# Patient Record
Sex: Male | Born: 1945 | Race: White | Hispanic: No | Marital: Married | Smoking: Former smoker
Health system: Southern US, Community
[De-identification: ages and names within clinical notes are randomized; demographics above are authoritative.]

## PROBLEM LIST (undated history)

## (undated) DIAGNOSIS — H269 Unspecified cataract: Secondary | ICD-10-CM

## (undated) DIAGNOSIS — J302 Other seasonal allergic rhinitis: Secondary | ICD-10-CM

## (undated) DIAGNOSIS — I499 Cardiac arrhythmia, unspecified: Secondary | ICD-10-CM

## (undated) DIAGNOSIS — E7800 Pure hypercholesterolemia, unspecified: Secondary | ICD-10-CM

## (undated) DIAGNOSIS — Z973 Presence of spectacles and contact lenses: Secondary | ICD-10-CM

## (undated) DIAGNOSIS — I10 Essential (primary) hypertension: Secondary | ICD-10-CM

## (undated) DIAGNOSIS — M1990 Unspecified osteoarthritis, unspecified site: Secondary | ICD-10-CM

## (undated) DIAGNOSIS — I509 Heart failure, unspecified: Secondary | ICD-10-CM

## (undated) DIAGNOSIS — E119 Type 2 diabetes mellitus without complications: Secondary | ICD-10-CM

## (undated) DIAGNOSIS — G4730 Sleep apnea, unspecified: Secondary | ICD-10-CM

## (undated) DIAGNOSIS — R011 Cardiac murmur, unspecified: Secondary | ICD-10-CM

## (undated) DIAGNOSIS — K219 Gastro-esophageal reflux disease without esophagitis: Secondary | ICD-10-CM

## (undated) HISTORY — PX: BACK SURGERY: SHX140

## (undated) HISTORY — PX: HERNIA REPAIR: SHX51

## (undated) HISTORY — PX: INGUINAL HERNIA REPAIR: SHX194

## (undated) HISTORY — PX: COLONOSCOPY: SHX174

---

## 1982-09-26 HISTORY — PX: BACK SURGERY: SHX140

## 2011-09-27 HISTORY — PX: COLONOSCOPY: SHX174

## 2014-07-28 ENCOUNTER — Other Ambulatory Visit: Payer: Self-pay | Admitting: Otolaryngology

## 2014-08-05 ENCOUNTER — Encounter (HOSPITAL_COMMUNITY): Payer: Self-pay

## 2014-08-05 ENCOUNTER — Encounter (HOSPITAL_COMMUNITY)
Admission: RE | Admit: 2014-08-05 | Discharge: 2014-08-05 | Disposition: A | Discharge status: Home / Self Care | Payer: Medicare Other | Source: Ambulatory Visit | Attending: Otolaryngology | Admitting: Otolaryngology

## 2014-08-05 DIAGNOSIS — E119 Type 2 diabetes mellitus without complications: Secondary | ICD-10-CM | POA: Diagnosis not present

## 2014-08-05 DIAGNOSIS — E780 Pure hypercholesterolemia: Secondary | ICD-10-CM | POA: Diagnosis not present

## 2014-08-05 DIAGNOSIS — I10 Essential (primary) hypertension: Secondary | ICD-10-CM | POA: Diagnosis not present

## 2014-08-05 DIAGNOSIS — K219 Gastro-esophageal reflux disease without esophagitis: Secondary | ICD-10-CM | POA: Diagnosis not present

## 2014-08-05 DIAGNOSIS — I509 Heart failure, unspecified: Secondary | ICD-10-CM | POA: Diagnosis not present

## 2014-08-05 DIAGNOSIS — Z79899 Other long term (current) drug therapy: Secondary | ICD-10-CM | POA: Diagnosis not present

## 2014-08-05 DIAGNOSIS — H269 Unspecified cataract: Secondary | ICD-10-CM | POA: Diagnosis not present

## 2014-08-05 DIAGNOSIS — D110 Benign neoplasm of parotid gland: Secondary | ICD-10-CM | POA: Diagnosis not present

## 2014-08-05 DIAGNOSIS — G4730 Sleep apnea, unspecified: Secondary | ICD-10-CM | POA: Diagnosis not present

## 2014-08-05 DIAGNOSIS — Z720 Tobacco use: Secondary | ICD-10-CM | POA: Diagnosis not present

## 2014-08-05 HISTORY — DX: Pure hypercholesterolemia, unspecified: E78.00

## 2014-08-05 HISTORY — DX: Heart failure, unspecified: I50.9

## 2014-08-05 HISTORY — DX: Essential (primary) hypertension: I10

## 2014-08-05 HISTORY — DX: Sleep apnea, unspecified: G47.30

## 2014-08-05 HISTORY — DX: Cardiac murmur, unspecified: R01.1

## 2014-08-05 HISTORY — DX: Gastro-esophageal reflux disease without esophagitis: K21.9

## 2014-08-05 HISTORY — DX: Other seasonal allergic rhinitis: J30.2

## 2014-08-05 HISTORY — DX: Presence of spectacles and contact lenses: Z97.3

## 2014-08-05 HISTORY — DX: Type 2 diabetes mellitus without complications: E11.9

## 2014-08-05 HISTORY — DX: Unspecified cataract: H26.9

## 2014-08-05 LAB — BASIC METABOLIC PANEL
ANION GAP: 15 (ref 5–15)
BUN: 15 mg/dL (ref 6–23)
CHLORIDE: 102 meq/L (ref 96–112)
CO2: 24 meq/L (ref 19–32)
Calcium: 9.7 mg/dL (ref 8.4–10.5)
Creatinine, Ser: 1.04 mg/dL (ref 0.50–1.35)
GFR calc Af Amer: 84 mL/min — ABNORMAL LOW (ref >90)
GFR calc non Af Amer: 73 mL/min — ABNORMAL LOW (ref >90)
Glucose, Bld: 92 mg/dL (ref 70–99)
POTASSIUM: 4.2 meq/L (ref 3.7–5.3)
Sodium: 141 mEq/L (ref 137–147)

## 2014-08-05 LAB — CBC
HCT: 38.3 % — ABNORMAL LOW (ref 39.0–52.0)
HEMOGLOBIN: 12.7 g/dL — AB (ref 13.0–17.0)
MCH: 30.2 pg (ref 26.0–34.0)
MCHC: 33.2 g/dL (ref 30.0–36.0)
MCV: 91.2 fL (ref 78.0–100.0)
Platelets: 185 10*3/uL (ref 150–400)
RBC: 4.2 MIL/uL — AB (ref 4.22–5.81)
RDW: 15.1 % (ref 11.5–15.5)
WBC: 8.5 10*3/uL (ref 4.0–10.5)

## 2014-08-05 NOTE — Pre-Procedure Instructions (Signed)
James Paul  08/05/2014   Your procedure is scheduled on: Friday, August 08, 2014  Report to [facility] Admitting at 6:45 AM.  Call this number if you have problems the morning of surgery: [phone]   Remember: DO NOT [place]   Do not eat food or drink liquids after midnight Thursday, August 07, 2014   Take these medicines the morning of surgery with A SIP OF WATER: amLODipine (NORVASC), atenolol (TENORMIN)   Stop taking Aspirin, vitamins, and herbal medications. Do not take any NSAIDs ie: Ibuprofen, Advil, Naproxen or any medication containing Aspirin.   Do not wear jewelry, make-up or nail polish.  Do not wear lotions, powders, or perfumes. You may not  wear deodorant.  Do not shave 48 hours prior to surgery. Men may shave face and neck.  Do not bring valuables to the hospital.  [facility] is not responsible for any belongings or valuables.               Contacts, dentures or bridgework may not be worn into surgery.  Leave suitcase in the car. After surgery it may be brought to your room.  For patients admitted to the hospital, discharge time is determined by your treatment team.               Patients discharged the day of surgery will not be allowed to drive home.  Name and phone number of your driver:   Special Instructions:  Special Instructions:Special Instructions: [facility] - Preparing for Surgery  Before surgery, you can play an important role.  Because skin is not sterile, your skin needs to be as free of germs as possible.  You can reduce the number of germs on you skin by washing with CHG (chlorahexidine gluconate) soap before surgery.  CHG is an antiseptic cleaner which kills germs and bonds with the skin to continue killing germs even after washing.  Please DO NOT use if you have an allergy to CHG or antibacterial soaps.  If your skin becomes reddened/irritated stop using the CHG and inform your  nurse when you arrive at Short Stay.  Do not shave (including legs and underarms) for at least 48 hours prior to the first CHG shower.  You may shave your face.  Please follow these instructions carefully:   1.  Shower with CHG Soap the night before surgery and the morning of Surgery.  2.  If you choose to wash your hair, wash your hair first as usual with your normal shampoo.  3.  After you shampoo, rinse your hair and body thoroughly to remove the Shampoo.  4.  Use CHG as you would any other liquid soap.  You can apply chg directly  to the skin and wash gently with scrungie or a clean washcloth.  5.  Apply the CHG Soap to your body ONLY FROM THE NECK DOWN.  Do not use on open wounds or open sores.  Avoid contact with your eyes, ears, mouth and genitals (private parts).  Wash genitals (private parts) with your normal soap.  6.  Wash thoroughly, paying special attention to the area where your surgery will be performed.  7.  Thoroughly rinse your body with warm water from the neck down.  8.  DO NOT shower/wash with your normal soap after using and rinsing off the CHG Soap.  9.  Pat yourself dry with a clean towel.  10.  Wear clean pajamas.            11.  Place clean sheets on your bed the night of your first shower and do not sleep with pets.  Day of Surgery  Do not apply any lotions/deodorants the morning of surgery.  Please wear clean clothes to the hospital/surgery center.   Please read over the following fact sheets that you were given: Pain Booklet, Coughing and Deep Breathing and Surgical Site Infection Prevention

## 2014-08-06 NOTE — Progress Notes (Signed)
Voice message left with Glenda in Dr Janace Hoard office to inform him of need to sign orders in EPIC.

## 2014-08-07 NOTE — Progress Notes (Signed)
refaxed request to [facility] med. Center for cardiac studies.

## 2014-08-07 NOTE — Progress Notes (Signed)
Faxed request made yesterday to [facility]. Center-phone call made to health information records- produces no answer today.

## 2014-08-08 ENCOUNTER — Ambulatory Visit (HOSPITAL_COMMUNITY): Payer: Medicare Other | Admitting: Certified Registered"

## 2014-08-08 ENCOUNTER — Encounter (HOSPITAL_COMMUNITY)
Admission: RE | Disposition: A | Discharge status: Home / Self Care | Payer: Self-pay | Source: Ambulatory Visit | Attending: Otolaryngology

## 2014-08-08 ENCOUNTER — Observation Stay (HOSPITAL_COMMUNITY)
Admission: RE | Admit: 2014-08-08 | Discharge: 2014-08-09 | Disposition: A | Discharge status: Home / Self Care | Payer: Medicare Other | Source: Ambulatory Visit | Attending: Otolaryngology | Admitting: Otolaryngology

## 2014-08-08 ENCOUNTER — Encounter (HOSPITAL_COMMUNITY): Payer: Self-pay | Admitting: *Deleted

## 2014-08-08 DIAGNOSIS — H269 Unspecified cataract: Secondary | ICD-10-CM | POA: Insufficient documentation

## 2014-08-08 DIAGNOSIS — I509 Heart failure, unspecified: Secondary | ICD-10-CM | POA: Insufficient documentation

## 2014-08-08 DIAGNOSIS — K118 Other diseases of salivary glands: Secondary | ICD-10-CM | POA: Diagnosis present

## 2014-08-08 DIAGNOSIS — D110 Benign neoplasm of parotid gland: Principal | ICD-10-CM | POA: Insufficient documentation

## 2014-08-08 DIAGNOSIS — K219 Gastro-esophageal reflux disease without esophagitis: Secondary | ICD-10-CM | POA: Insufficient documentation

## 2014-08-08 DIAGNOSIS — G4730 Sleep apnea, unspecified: Secondary | ICD-10-CM | POA: Diagnosis not present

## 2014-08-08 DIAGNOSIS — I10 Essential (primary) hypertension: Secondary | ICD-10-CM | POA: Diagnosis not present

## 2014-08-08 DIAGNOSIS — Z720 Tobacco use: Secondary | ICD-10-CM | POA: Insufficient documentation

## 2014-08-08 DIAGNOSIS — E780 Pure hypercholesterolemia: Secondary | ICD-10-CM | POA: Insufficient documentation

## 2014-08-08 DIAGNOSIS — E119 Type 2 diabetes mellitus without complications: Secondary | ICD-10-CM | POA: Insufficient documentation

## 2014-08-08 DIAGNOSIS — Z79899 Other long term (current) drug therapy: Secondary | ICD-10-CM | POA: Insufficient documentation

## 2014-08-08 HISTORY — PX: PAROTIDECTOMY: SHX2163

## 2014-08-08 HISTORY — DX: Other diseases of salivary glands: K11.8

## 2014-08-08 LAB — CBC
HCT: 33.4 % — ABNORMAL LOW (ref 39.0–52.0)
HEMOGLOBIN: 10.9 g/dL — AB (ref 13.0–17.0)
MCH: 30.5 pg (ref 26.0–34.0)
MCHC: 32.6 g/dL (ref 30.0–36.0)
MCV: 93.6 fL (ref 78.0–100.0)
Platelets: 148 10*3/uL — ABNORMAL LOW (ref 150–400)
RBC: 3.57 MIL/uL — ABNORMAL LOW (ref 4.22–5.81)
RDW: 15.1 % (ref 11.5–15.5)
WBC: 7.2 10*3/uL (ref 4.0–10.5)

## 2014-08-08 LAB — CREATININE, SERUM
CREATININE: 0.9 mg/dL (ref 0.50–1.35)
GFR calc Af Amer: >90 mL/min (ref >90)
GFR calc non Af Amer: 87 mL/min — ABNORMAL LOW (ref >90)

## 2014-08-08 LAB — GLUCOSE, CAPILLARY
Glucose-Capillary: 125 mg/dL — ABNORMAL HIGH (ref 70–99)
Glucose-Capillary: 121 mg/dL — ABNORMAL HIGH (ref 70–99)

## 2014-08-08 SURGERY — EXCISION, PAROTID GLAND
Anesthesia: General | Site: Neck | Laterality: Left

## 2014-08-08 MED ORDER — ONDANSETRON HCL 4 MG/2ML IJ SOLN
INTRAMUSCULAR | Status: DC | PRN
  Administered 2014-08-08: 4 mg via INTRAVENOUS

## 2014-08-08 MED ORDER — FENTANYL CITRATE 0.05 MG/ML IJ SOLN
INTRAMUSCULAR | Status: DC | PRN
  Administered 2014-08-08 (×4): 50 ug via INTRAVENOUS
  Administered 2014-08-08: 100 ug via INTRAVENOUS
  Administered 2014-08-08 (×2): 50 ug via INTRAVENOUS

## 2014-08-08 MED ORDER — LIDOCAINE-EPINEPHRINE 1 %-1:100000 IJ SOLN
INTRAMUSCULAR | Status: AC
  Filled 2014-08-08: qty 1

## 2014-08-08 MED ORDER — FENTANYL CITRATE 0.05 MG/ML IJ SOLN
INTRAMUSCULAR | Status: AC
  Filled 2014-08-08: qty 5

## 2014-08-08 MED ORDER — GLYCOPYRROLATE 0.2 MG/ML IJ SOLN
INTRAMUSCULAR | Status: DC | PRN
  Administered 2014-08-08: 0.2 mg via INTRAVENOUS

## 2014-08-08 MED ORDER — PHENYLEPHRINE HCL 10 MG/ML IJ SOLN
INTRAMUSCULAR | Status: DC | PRN
  Administered 2014-08-08: 80 ug via INTRAVENOUS
  Administered 2014-08-08: 160 ug via INTRAVENOUS
  Administered 2014-08-08: 80 ug via INTRAVENOUS

## 2014-08-08 MED ORDER — PROPOFOL 10 MG/ML IV BOLUS
INTRAVENOUS | Status: DC | PRN
  Administered 2014-08-08: 30 mg via INTRAVENOUS
  Administered 2014-08-08: 60 mg via INTRAVENOUS
  Administered 2014-08-08 (×3): 30 mg via INTRAVENOUS
  Administered 2014-08-08: 40 mg via INTRAVENOUS
  Administered 2014-08-08: 30 mg via INTRAVENOUS
  Administered 2014-08-08: 160 mg via INTRAVENOUS

## 2014-08-08 MED ORDER — HYDROMORPHONE HCL 1 MG/ML IJ SOLN: 0.5000 mg | INTRAMUSCULAR | Status: DC | PRN

## 2014-08-08 MED ORDER — PROPOFOL 10 MG/ML IV BOLUS
INTRAVENOUS | Status: AC
  Filled 2014-08-08: qty 20

## 2014-08-08 MED ORDER — BACITRACIN ZINC 500 UNIT/GM EX OINT
TOPICAL_OINTMENT | CUTANEOUS | Status: AC
  Filled 2014-08-08: qty 15

## 2014-08-08 MED ORDER — EPHEDRINE SULFATE 50 MG/ML IJ SOLN
INTRAMUSCULAR | Status: DC | PRN
  Administered 2014-08-08: 15 mg via INTRAVENOUS
  Administered 2014-08-08 (×2): 10 mg via INTRAVENOUS

## 2014-08-08 MED ORDER — ONDANSETRON HCL 4 MG/2ML IJ SOLN
INTRAMUSCULAR | Status: AC
  Filled 2014-08-08: qty 2

## 2014-08-08 MED ORDER — ONDANSETRON HCL 4 MG/2ML IJ SOLN: 4.0000 mg | Freq: Four times a day (QID) | INTRAMUSCULAR | Status: DC | PRN

## 2014-08-08 MED ORDER — PHENYLEPHRINE HCL 10 MG/ML IJ SOLN
10.0000 mg | INTRAVENOUS | Status: DC | PRN
  Administered 2014-08-08: 20 ug/min via INTRAVENOUS

## 2014-08-08 MED ORDER — ARTIFICIAL TEARS OP OINT
TOPICAL_OINTMENT | OPHTHALMIC | Status: DC | PRN
  Administered 2014-08-08: 1 via OPHTHALMIC

## 2014-08-08 MED ORDER — LIDOCAINE HCL (CARDIAC) 20 MG/ML IV SOLN
INTRAVENOUS | Status: AC
  Filled 2014-08-08: qty 5

## 2014-08-08 MED ORDER — HYDROCODONE-ACETAMINOPHEN 5-325 MG PO TABS
1.0000 | ORAL_TABLET | ORAL | Status: DC | PRN
  Administered 2014-08-08 – 2014-08-09 (×2): 2 via ORAL
  Filled 2014-08-08 (×2): qty 2

## 2014-08-08 MED ORDER — MIDAZOLAM HCL 2 MG/2ML IJ SOLN
INTRAMUSCULAR | Status: AC
  Filled 2014-08-08: qty 2

## 2014-08-08 MED ORDER — SUCCINYLCHOLINE CHLORIDE 20 MG/ML IJ SOLN
INTRAMUSCULAR | Status: DC | PRN
  Administered 2014-08-08: 60 mg via INTRAVENOUS

## 2014-08-08 MED ORDER — ARTIFICIAL TEARS OP OINT
TOPICAL_OINTMENT | OPHTHALMIC | Status: AC
  Filled 2014-08-08: qty 3.5

## 2014-08-08 MED ORDER — LACTATED RINGERS IV SOLN
INTRAVENOUS | Status: DC
  Administered 2014-08-08 (×2): via INTRAVENOUS

## 2014-08-08 MED ORDER — ROCURONIUM BROMIDE 50 MG/5ML IV SOLN
INTRAVENOUS | Status: AC
  Filled 2014-08-08: qty 1

## 2014-08-08 MED ORDER — MIDAZOLAM HCL 5 MG/5ML IJ SOLN
INTRAMUSCULAR | Status: DC | PRN
  Administered 2014-08-08: 2 mg via INTRAVENOUS

## 2014-08-08 MED ORDER — LIDOCAINE HCL (CARDIAC) 20 MG/ML IV SOLN
INTRAVENOUS | Status: DC | PRN
  Administered 2014-08-08: 60 mg via INTRAVENOUS

## 2014-08-08 MED ORDER — HEPARIN SODIUM (PORCINE) 5000 UNIT/ML IJ SOLN
5000.0000 [IU] | Freq: Three times a day (TID) | INTRAMUSCULAR | Status: DC
  Administered 2014-08-08 – 2014-08-09 (×2): 5000 [IU] via SUBCUTANEOUS
  Filled 2014-08-08 (×5): qty 1

## 2014-08-08 SURGICAL SUPPLY — 52 items
ATTRACTOMAT 16X20 MAGNETIC DRP (DRAPES) ×3 IMPLANT
BLADE 10 SAFETY STRL DISP (BLADE) ×3 IMPLANT
BLADE 15 SAFETY STRL DISP (BLADE) ×3 IMPLANT
BLADE SURG 12 STRL SS (BLADE) ×3 IMPLANT
CANISTER SUCTION 2500CC (MISCELLANEOUS) ×3 IMPLANT
CLEANER TIP ELECTROSURG 2X2 (MISCELLANEOUS) ×3 IMPLANT
CONT SPEC 4OZ CLIKSEAL STRL BL (MISCELLANEOUS) ×3 IMPLANT
CORDS BIPOLAR (ELECTRODE) ×3 IMPLANT
COVER SURGICAL LIGHT HANDLE (MISCELLANEOUS) ×6 IMPLANT
CRADLE DONUT ADULT HEAD (MISCELLANEOUS) ×3 IMPLANT
DERMABOND ADHESIVE PROPEN (GAUZE/BANDAGES/DRESSINGS) ×4
DERMABOND ADVANCED .7 DNX6 (GAUZE/BANDAGES/DRESSINGS) ×2 IMPLANT
DRAIN JACKSON RD 7FR 3/32 (WOUND CARE) ×3 IMPLANT
DRAPE INCISE 13X13 STRL (DRAPES) ×3 IMPLANT
DRAPE PROXIMA HALF (DRAPES) ×3 IMPLANT
ELECT COATED BLADE 2.86 ST (ELECTRODE) ×3 IMPLANT
ELECT PAIRED SUBDERMAL (MISCELLANEOUS) ×3
ELECT REM PT RETURN 9FT ADLT (ELECTROSURGICAL) ×3
ELECTRODE PAIRED SUBDERMAL (MISCELLANEOUS) ×1 IMPLANT
ELECTRODE REM PT RTRN 9FT ADLT (ELECTROSURGICAL) ×1 IMPLANT
EVACUATOR SILICONE 100CC (DRAIN) ×3 IMPLANT
GAUZE SPONGE 4X4 16PLY XRAY LF (GAUZE/BANDAGES/DRESSINGS) ×3 IMPLANT
GLOVE BIO SURGEON STRL SZ 6.5 (GLOVE) ×2 IMPLANT
GLOVE BIO SURGEON STRL SZ7 (GLOVE) ×3 IMPLANT
GLOVE BIO SURGEONS STRL SZ 6.5 (GLOVE) ×1
GLOVE BIOGEL PI IND STRL 6 (GLOVE) ×2 IMPLANT
GLOVE BIOGEL PI IND STRL 7.0 (GLOVE) ×1 IMPLANT
GLOVE BIOGEL PI INDICATOR 6 (GLOVE) ×4
GLOVE BIOGEL PI INDICATOR 7.0 (GLOVE) ×2
GLOVE ECLIPSE 7.5 STRL STRAW (GLOVE) ×3 IMPLANT
GOWN STRL REUS W/ TWL LRG LVL3 (GOWN DISPOSABLE) ×3 IMPLANT
GOWN STRL REUS W/TWL LRG LVL3 (GOWN DISPOSABLE) ×6
KIT BASIN OR (CUSTOM PROCEDURE TRAY) ×3 IMPLANT
KIT ROOM TURNOVER OR (KITS) ×3 IMPLANT
NS IRRIG 1000ML POUR BTL (IV SOLUTION) ×3 IMPLANT
PAD ARMBOARD 7.5X6 YLW CONV (MISCELLANEOUS) ×6 IMPLANT
PENCIL FOOT CONTROL (ELECTRODE) ×3 IMPLANT
PROBE NERVBE PRASS .33 (MISCELLANEOUS) ×3 IMPLANT
SPONGE INTESTINAL PEANUT (DISPOSABLE) ×3 IMPLANT
STAPLER VISISTAT 35W (STAPLE) ×3 IMPLANT
SUT CHROMIC 4 0 PS 2 18 (SUTURE) ×9 IMPLANT
SUT ETHILON 3 0 PS 1 (SUTURE) ×3 IMPLANT
SUT ETHILON 5 0 P 3 18 (SUTURE) ×2
SUT NYLON ETHILON 5-0 P-3 1X18 (SUTURE) ×1 IMPLANT
SUT SILK 2 0 FS (SUTURE) ×3 IMPLANT
SUT SILK 2 0 SH CR/8 (SUTURE) ×3 IMPLANT
SUT SILK 4 0 TIE 10X30 (SUTURE) ×3 IMPLANT
SUT SILK 4 0 TIES 17X18 (SUTURE) ×3 IMPLANT
TOWEL OR 17X24 6PK STRL BLUE (TOWEL DISPOSABLE) ×3 IMPLANT
TOWEL OR 17X26 10 PK STRL BLUE (TOWEL DISPOSABLE) ×3 IMPLANT
TRAY ENT MC OR (CUSTOM PROCEDURE TRAY) ×3 IMPLANT
WATER STERILE IRR 1000ML POUR (IV SOLUTION) ×3 IMPLANT

## 2014-08-08 NOTE — Progress Notes (Signed)
Pt's spouse brought his CPAP mask back to PACU for pt's comfort. Minimum auto cpap pressure increased to 12cmH20 and max pressure left at 20cmH20 with 4L O2 bled in. Pt tolerating own mask much better than our mask. RT will continue to monitor

## 2014-08-08 NOTE — Progress Notes (Signed)
Placed pt on CPAP with 3L O2 bleed in per PACU RN.

## 2014-08-08 NOTE — Discharge Instructions (Signed)
Call if any increased swelling, pain, or redness. Do not put any antibiotic cream or any lotion on the wound. It will dissolve the glue. Follow up in one week

## 2014-08-08 NOTE — Op Note (Signed)
preop/postop diagnosis: Left parotid mass Procedure: Left parotidectomy with facial nerve monitoring Anesthesia: Gen. Estimated blood loss: Less than 25 mL Indications: 68 year old with a large mass in his left parotid that has enlarged over the last 6 months. He wants to have it removed with no fine-needle aspiration. We discussed the procedure. We discussed risks, benefits, and options. All his questions were answered and consent was obtained. Procedure: Patient was taken to the operating room placed in the supine position after general endotracheal tube anesthesia was placed in the right gaze position the facial nerve monitor was positioned calibrated with low impedance and good function. The patient was prepped and draped in the usual sterile manner. A modified Blair incision was performed in the preauricular area extending it down into the neck. The mass was very large in the tail portion of the parotid. The incision was made with electrocautery. Anterior and posterior flaps are elevated. The dissection was carried out along the sternocleidomastoid muscle taking the parotid tissue off the muscle up to the attachment of the mastoid tip. Dissection was carried down the cartilage following the tragal pointer. The nerve was easily identified with the hemostat and facial nerve monitor stimulation. The trunk was then dissected up to the bifurcation. The tissue was taken laterally and the tumor along with the parotid tissue was rotated anteriorly.  The upper branch of the nerve was not dissected only the inferior branch was followed. This was dissected carefully bringing the parotid tissue off of the nerve layer by layer and the tumor was obviously in mostly the tail of the parotid. It was quite large and was dissected out layer by layer using bipolar cautery and the facial nerve monitor. The tumor was removed and sent for permanent section. It did go slightly below the inferior branch of the nerve. Using the  monitor the nerve at its trunk was stimulated and the branches seemed to all functional. The wound was irrigated. The #7 JP drain was placed. The wound was closed with interrupted 4-0 chromic. The drain was secured with a 5-0 nylon. The skin was closed with Dermabond. He was awakened and brought to recovery room in stable condition counts correct

## 2014-08-08 NOTE — Progress Notes (Signed)
Pt placed on Auto CPAP min pressure of 6cmH20, max pressure of 20cmH20 with 3L O2 bled in. Pt's sats ranging anywhere from 90%-97%. RT will continue to monitor.

## 2014-08-08 NOTE — H&P (Signed)
James Paul is an 68 y.o. male.   Chief Complaint: left parotid mass HPI: History of left mass in the parotid that seems to have enlarged over the last 6 months. It has at least doubled in size.. The patient was given the option of fine-needle aspiration and continue to observe but he definitely wants to remove it regardless of what the needle biopsy would show. He is here for excision  Past Medical History  Diagnosis Date  . Sleep apnea   . Hypertension   . Wears glasses   . Hypercholesterolemia   . GERD (gastroesophageal reflux disease)     PMH  . Cataract     B/L early cataracts  . Heart murmur   . Seasonal allergies   . Diabetes mellitus without complication   . CHF (congestive heart failure)     Past Surgical History  Procedure Laterality Date  . Back surgery    . Colonoscopy    . Hernia repair      Family History  Problem Relation Age of Onset  . Congestive Heart Failure Mother   . Aneurysm Father   . Congestive Heart Failure Sister    Social History:  reports that he has been smoking Cigarettes.  He has been smoking about 0.50 packs per day. He has never used smokeless tobacco. He reports that he drinks alcohol. He reports that he does not use illicit drugs.  Allergies: No Known Allergies  Medications Prior to Admission  Medication Sig Dispense Refill  . amLODipine (NORVASC) 10 MG tablet Take 5 mg by mouth daily.    Marland Kitchen atenolol (TENORMIN) 25 MG tablet Take 25 mg by mouth daily.    Marland Kitchen atorvastatin (LIPITOR) 40 MG tablet Take 20 mg by mouth daily.    . furosemide (LASIX) 40 MG tablet Take 40 mg by mouth daily.    Marland Kitchen lisinopril (PRINIVIL,ZESTRIL) 10 MG tablet Take 5 mg by mouth daily.    . metFORMIN (GLUCOPHAGE) 500 MG tablet Take 500 mg by mouth daily.    . potassium chloride SA (K-DUR,KLOR-CON) 20 MEQ tablet Take 20 mEq by mouth daily.      Results for orders placed or performed during the hospital encounter of 08/08/14 (from the past 48 hour(s))  Glucose,  capillary     Status: Abnormal   Collection Time: 08/08/14  6:53 AM  Result Value Ref Range   Glucose-Capillary 125 (H) 70 - 99 mg/dL   No results found.  Review of Systems  Constitutional: Negative.   HENT: Negative.   Eyes: Negative.   Respiratory: Negative.   Cardiovascular: Negative.   Gastrointestinal: Negative.   Musculoskeletal: Negative.   Skin: Negative.     Blood pressure 140/49, pulse 73, temperature 98.3 F (36.8 C), temperature source Oral, resp. rate 20, height 5\' 9"  (1.753 m), weight 104.781 kg (231 lb), SpO2 95 %. Physical Exam  Constitutional: He appears well-developed.  HENT:  Head: Normocephalic.  Mouth/Throat: Oropharynx is clear and moist.  Mass in the left parotid gland.  Eyes: Pupils are equal, round, and reactive to light.  Neck: Normal range of motion. Neck supple.  Cardiovascular: Normal rate.   Respiratory: Effort normal.  GI: Soft.  Musculoskeletal: Normal range of motion.     Assessment/Plan Left parotid mass-we discussed the procedure of parotidectomy. He is ready to proceed.  Melissa Montane 08/08/2014, 8:02 AM

## 2014-08-08 NOTE — Anesthesia Postprocedure Evaluation (Signed)
  Anesthesia Post-op Note  Patient: James Paul  Procedure(s) Performed: Procedure(s): PAROTIDECTOMY (Left)  Patient Location: PACU  Anesthesia Type:General  Level of Consciousness: awake and alert   Airway and Oxygen Therapy: Patient Spontanous Breathing and on home CPAP  Post-op Pain: mild  Post-op Assessment: Post-op Vital signs reviewed, Patient's Cardiovascular Status Stable, Respiratory Function Stable, Patent Airway, No signs of Nausea or vomiting and Pain level controlled  Post-op Vital Signs: Reviewed and stable  Last Vitals:  Filed Vitals:   08/08/14 1810  BP: 105/53  Pulse: 77  Temp: 37 C  Resp: 16    Complications: No apparent anesthesia complications

## 2014-08-08 NOTE — Transfer of Care (Signed)
Immediate Anesthesia Transfer of Care Note  Patient: James Paul  Procedure(s) Performed: Procedure(s): PAROTIDECTOMY (Left)  Patient Location: PACU  Anesthesia Type:General  Level of Consciousness: awake and oriented  Airway & Oxygen Therapy: Patient Spontanous Breathing and non-rebreather face mask  Post-op Assessment: Report given to PACU RN  Post vital signs: Reviewed and stable  Complications: No apparent anesthesia complications

## 2014-08-08 NOTE — Progress Notes (Signed)
May apply Cpap = per Dr Ermalene Postin

## 2014-08-08 NOTE — Progress Notes (Signed)
Auto cpap max pressure 20 min 6, 3L 02

## 2014-08-08 NOTE — Progress Notes (Signed)
08/08/2014 7:30 PM  Lorayne Bender [id number]  Post-Op Check   Temp:  [97 F (36.1 C)-98.6 F (37 C)] 98.6 F (37 C) (11/13 1810) Pulse Rate:  [59-77] 77 (11/13 1810) Resp:  [7-27] 16 (11/13 1810) BP: (105-140)/(48-68) 105/53 mmHg (11/13 1810) SpO2:  [88 %-99 %] 94 % (11/13 1810) Weight:  [104.781 kg (231 lb)] 104.781 kg (231 lb) (11/13 0651),     Intake/Output Summary (Last 24 hours) at 08/08/14 1930 Last data filed at 08/08/14 1835  Gross per 24 hour  Intake   1200 ml  Output     20 ml  Net   1180 ml   Drain 20 ml  Results for orders placed or performed during the hospital encounter of 08/08/14 (from the past 24 hour(s))  Glucose, capillary     Status: Abnormal   Collection Time: 08/08/14  6:53 AM  Result Value Ref Range   Glucose-Capillary 125 (H) 70 - 99 mg/dL  Glucose, capillary     Status: Abnormal   Collection Time: 08/08/14 11:50 AM  Result Value Ref Range   Glucose-Capillary 121 (H) 70 - 99 mg/dL   Comment 1 Documented in Chart    Comment 2 Notify RN   CBC     Status: Abnormal   Collection Time: 08/08/14  3:20 PM  Result Value Ref Range   WBC 7.2 4.0 - 10.5 K/uL   RBC 3.57 (L) 4.22 - 5.81 MIL/uL   Hemoglobin 10.9 (L) 13.0 - 17.0 g/dL   HCT 33.4 (L) 39.0 - 52.0 %   MCV 93.6 78.0 - 100.0 fL   MCH 30.5 26.0 - 34.0 pg   MCHC 32.6 30.0 - 36.0 g/dL   RDW 15.1 11.5 - 15.5 %   Platelets 148 (L) 150 - 400 K/uL  Creatinine, serum     Status: Abnormal   Collection Time: 08/08/14  3:20 PM  Result Value Ref Range   Creatinine, Ser 0.90 0.50 - 1.35 mg/dL   GFR calc non Af Amer 87 (L) >90 mL/min   GFR calc Af Amer >90 >90 mL/min    SUBJECTIVE:  Min pain.  No chest pain or SOB.  Breathing, swallowing, voicing fine.  Spont void.    OBJECTIVE:  Facial n completely intact.  Wound flat.  Drain functioning.   IMPRESSION:  Satisfactory check  PLAN:  Routine.  Drain out and home in AM.    [place], [place]

## 2014-08-08 NOTE — Anesthesia Procedure Notes (Signed)
Procedure Name: Intubation Date/Time: 08/08/2014 9:05 AM Performed by: Sampson Si E Pre-anesthesia Checklist: Patient identified, Emergency Drugs available, Suction available and Patient being monitored Patient Re-evaluated:Patient Re-evaluated prior to inductionOxygen Delivery Method: Circle system utilized Preoxygenation: Pre-oxygenation with 100% oxygen Intubation Type: IV induction Ventilation: Two handed mask ventilation required, Oral airway inserted - appropriate to patient size and Mask ventilation without difficulty Laryngoscope Size: Mac and 3 Grade View: Grade I Tube type: Oral Number of attempts: 1 Airway Equipment and Method: Stylet Placement Confirmation: ETT inserted through vocal cords under direct vision,  positive ETCO2 and breath sounds checked- equal and bilateral Secured at: 22 cm Tube secured with: Tape Dental Injury: Teeth and Oropharynx as per pre-operative assessment

## 2014-08-08 NOTE — Anesthesia Preprocedure Evaluation (Addendum)
Anesthesia Evaluation  Patient identified by MRN, date of birth, ID band Patient awake    Reviewed: Allergy & Precautions, H&P , NPO status , Patient's Chart, lab work & pertinent test results  History of Anesthesia Complications Negative for: history of anesthetic complications  Airway Mallampati: III  TM Distance: >3 FB Neck ROM: Full    Dental  (+) Edentulous Upper, Partial Lower, Dental Advisory Given   Pulmonary neg shortness of breath, sleep apnea and Continuous Positive Airway Pressure Ventilation , neg COPDCurrent Smoker,  breath sounds clear to auscultation        Cardiovascular hypertension, Pt. on medications - angina+CHF - CAD and - Past MI + Valvular Problems/Murmurs AS Rhythm:Regular + Systolic murmurs Mild AS, EF 40%   Neuro/Psych negative neurological ROS  negative psych ROS   GI/Hepatic Neg liver ROS, GERD-  Medicated and Controlled,  Endo/Other  diabetes, Well Controlled, Type 2, Oral Hypoglycemic AgentsMorbid obesity  Renal/GU negative Renal ROS     Musculoskeletal   Abdominal   Peds  Hematology negative hematology ROS (+)   Anesthesia Other Findings   Reproductive/Obstetrics                           Anesthesia Physical Anesthesia Plan  ASA: III  Anesthesia Plan: General   Post-op Pain Management:    Induction: Intravenous  Airway Management Planned: Oral ETT  Additional Equipment: None  Intra-op Plan:   Post-operative Plan: Extubation in OR  Informed Consent: I have reviewed the patients History and Physical, chart, labs and discussed the procedure including the risks, benefits and alternatives for the proposed anesthesia with the patient or authorized representative who has indicated his/her understanding and acceptance.   Dental advisory given  Plan Discussed with: CRNA and Surgeon  Anesthesia Plan Comments:         Anesthesia Quick Evaluation

## 2014-08-09 DIAGNOSIS — D110 Benign neoplasm of parotid gland: Secondary | ICD-10-CM | POA: Diagnosis not present

## 2014-08-09 NOTE — Discharge Summary (Signed)
  08/09/2014 8:04 AM  Lorayne Bender [id number]  Post-Op Day 1    Temp:  [97 F (36.1 C)-98.6 F (37 C)] 97.6 F (36.4 C) (11/14 0750) Pulse Rate:  [59-77] 74 (11/14 0750) Resp:  [7-27] 16 (11/14 0750) BP: (105-127)/(48-69) 125/57 mmHg (11/14 0750) SpO2:  [88 %-99 %] 99 % (11/14 0750),     Intake/Output Summary (Last 24 hours) at 08/09/14 0804 Last data filed at 08/09/14 0608  Gross per 24 hour  Intake   1680 ml  Output     21 ml  Net   1659 ml   Drain 20 ml  Results for orders placed or performed during the hospital encounter of 08/08/14 (from the past 24 hour(s))  Glucose, capillary     Status: Abnormal   Collection Time: 08/08/14 11:50 AM  Result Value Ref Range   Glucose-Capillary 121 (H) 70 - 99 mg/dL   Comment 1 Documented in Chart    Comment 2 Notify RN   CBC     Status: Abnormal   Collection Time: 08/08/14  3:20 PM  Result Value Ref Range   WBC 7.2 4.0 - 10.5 K/uL   RBC 3.57 (L) 4.22 - 5.81 MIL/uL   Hemoglobin 10.9 (L) 13.0 - 17.0 g/dL   HCT 33.4 (L) 39.0 - 52.0 %   MCV 93.6 78.0 - 100.0 fL   MCH 30.5 26.0 - 34.0 pg   MCHC 32.6 30.0 - 36.0 g/dL   RDW 15.1 11.5 - 15.5 %   Platelets 148 (L) 150 - 400 K/uL  Creatinine, serum     Status: Abnormal   Collection Time: 08/08/14  3:20 PM  Result Value Ref Range   Creatinine, Ser 0.90 0.50 - 1.35 mg/dL   GFR calc non Af Amer 87 (L) >90 mL/min   GFR calc Af Amer >90 >90 mL/min    SUBJECTIVE:  No pain.  Breathing, voicing, swallowing well  OBJECTIVE:  Wound flat.  Drain removed without difficulty.  IMPRESSION:  Satisfactory check  PLAN:  Discharge home  Admit:  36 NOV Discharge:  14 NOV Final Diagnosis:  LEFT parotid neoplasm Proc:  LEFT superficial parotidectomy, 13 NOV Comp:  None Cond:  Ambulatory, taking good po.  No pain.   Recheck :  6 days, Dr. Janace Hoard Rx: none Instructions written and given  Hosp Course:  Underwent surgery, then observed with suction drain 23 hrs p op.  Advanced diet and  activity. Drain removed on POD 1.  Pt discharged to home and care of family.    Jodi Marble

## 2014-08-11 ENCOUNTER — Encounter (HOSPITAL_COMMUNITY): Payer: Self-pay | Admitting: Otolaryngology

## 2016-09-26 HISTORY — PX: CARDIAC VALVE REPLACEMENT: SHX585

## 2017-07-07 ENCOUNTER — Institutional Professional Consult (permissible substitution): Payer: Medicare Other | Admitting: Internal Medicine

## 2017-07-13 ENCOUNTER — Ambulatory Visit (INDEPENDENT_AMBULATORY_CARE_PROVIDER_SITE_OTHER)
Admission: RE | Admit: 2017-07-13 | Discharge: 2017-07-13 | Disposition: A | Discharge status: Home / Self Care | Payer: Non-veteran care | Source: Ambulatory Visit | Attending: Internal Medicine | Admitting: Internal Medicine

## 2017-07-13 ENCOUNTER — Ambulatory Visit (INDEPENDENT_AMBULATORY_CARE_PROVIDER_SITE_OTHER): Payer: Non-veteran care | Admitting: Internal Medicine

## 2017-07-13 ENCOUNTER — Encounter: Payer: Self-pay | Admitting: Internal Medicine

## 2017-07-13 VITALS — BP 128/78 | HR 79 | Ht 69.0 in | Wt 230.4 lb

## 2017-07-13 DIAGNOSIS — F17210 Nicotine dependence, cigarettes, uncomplicated: Secondary | ICD-10-CM

## 2017-07-13 DIAGNOSIS — R911 Solitary pulmonary nodule: Secondary | ICD-10-CM

## 2017-07-13 HISTORY — DX: Solitary pulmonary nodule: R91.1

## 2017-07-13 NOTE — Progress Notes (Signed)
Subjective:     Patient ID: James Paul, male   DOB: 28-May-1946,     MRN: [id number]  HPI  81  yowm active smoker with no copd on study x 2015 / osa on cpap /3lpm since about the same / sleeping well referred to pulmonary clinic 07/13/2017 by Dr   Durene Romans at [facility]  With again no evidence of airflow obst but concern re ?SPN not seen on plain cxr 07/13/2017    07/13/2017 1st [facility] office visit/ Marnell Mcdaniel   Chief Complaint  Patient presents with  . pulmonary consult    referred by Dr. Durene Romans for small nodule in the lung.   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 No unusual exp hx or h/o childhood pna/ asthma or knowledge of premature birth.  Sleeping ok on CPAP/3lpm flat position without nocturnal  or early am exacerbation  of respiratory  c/o's or need for noct saba. Also denies any obvious fluctuation of symptoms with weather or environmental changes or other aggravating or alleviating factors except as outlined above   Current Allergies, Complete Past Medical History, Past Surgical History, Family History, and Social History were reviewed in [employer] record.  ROS  The following are not active complaints unless bolded Hoarseness, sore throat, dysphagia, dental problems, itching, sneezing,  nasal congestion or discharge of excess mucus or purulent secretions, ear ache,   fever, chills, sweats, unintended wt loss or wt gain, classically pleuritic or exertional cp,  orthopnea pnd or leg swelling, presyncope, palpitations, abdominal pain, anorexia, nausea, vomiting, diarrhea  or change in  bowel habits or change in bladder habits, change in stools or change in urine, dysuria, hematuria,  rash, arthralgias, visual complaints, headache, numbness, weakness or ataxia or problems with walking or coordination,  change in mood/affect or memory.        Current Meds  Medication Sig  . amLODipine (NORVASC) 10 MG tablet Take 5 mg by mouth daily.  Marland Kitchen aspirin EC 81 MG tablet Take 81 mg by mouth daily.  Marland Kitchen atenolol (TENORMIN) 25 MG tablet Take 25 mg by mouth daily.  Marland Kitchen atorvastatin (LIPITOR) 40 MG tablet Take 20 mg by mouth daily.  . furosemide (LASIX) 40 MG tablet Take 40 mg by mouth daily.  Marland Kitchen lisinopril (PRINIVIL,ZESTRIL) 10 MG tablet Take 5 mg by mouth daily.  . metFORMIN (GLUCOPHAGE) 500 MG tablet Take 500 mg by mouth daily.  Marland Kitchen omeprazole (PRILOSEC) 20 MG capsule Take 20 mg by mouth daily.  . potassium chloride SA (K-DUR,KLOR-CON) 20 MEQ tablet Take 20 mEq by mouth daily.  Marland Kitchen warfarin (COUMADIN) 5 MG tablet Take 5 mg by mouth as directed.           Review of Systems     Objective:   Physical Exam   Obese pleasant wm nad  Wt Readings from Last 3 Encounters:  07/13/17 230 lb 6 oz (104.5 kg)  08/08/14 231 lb (104.8 kg)  08/05/14 231 lb 6.4 oz (105 kg)    Vital signs reviewed  - Note on arrival 02  sats  93% on RA    HEENT: nl d  turbinates bilaterally, and oropharynx. Nl external ear canals without cough reflex - upper plate   NECK :  Fullness over parotids bilaterally without def mass/ without JVD/Nodes/TM/ nl carotid upstrokes bilaterally   LUNGS: no acc muscle use,  Nl contour chest which is clear to A and P bilaterally without cough on insp or exp maneuvers   CV:  RRR  With mechanical S1 and S2  II/VI sem  no s3   or increase in P2, and no edema   ABD:  Quite obes but soft and nontender with nl inspiratory excursion in the supine position. No bruits or organomegaly appreciated, bowel sounds nl  MS:  Nl gait/ ext warm without deformities, calf tenderness, cyanosis or  clubbing No obvious joint restrictions   SKIN: warm and dry without lesions    NEURO:  alert, approp, nl sensorium with  no motor or cerebellar deficits apparent.    CXR PA and Lateral:   07/13/2017 :    I personally reviewed images and agree with radiology impression as follows:    Mild chronic bronchitic changes, stable.  No overt CHF. My review:  No vis  Nodules      Assessment:

## 2017-07-13 NOTE — Patient Instructions (Signed)
Please remember to go to the  x-ray department downstairs in the basement  for your tests - we will call you with the results when they are available.      The key is to stop smoking completely before smoking completely stops you!    Once I have the information from the [place] I can make a recommendation re observation vs biopsy vs removal.  If you don't hear from [place] in 2 weeks it means I haven' received the information I need and you will have to go the the [place] to retrieve it - if you go see if you can get the Disc containing the CT images

## 2017-07-14 DIAGNOSIS — F17210 Nicotine dependence, cigarettes, uncomplicated: Secondary | ICD-10-CM | POA: Insufficient documentation

## 2017-07-14 HISTORY — DX: Nicotine dependence, cigarettes, uncomplicated: F17.210

## 2017-07-14 NOTE — Progress Notes (Signed)
Spoke with pt and notified of results per Dr. Wert. Pt verbalized understanding and denied any questions. 

## 2017-07-14 NOTE — Assessment & Plan Note (Signed)
CT at [facility] req 07/13/2017  - Spirometry 07/13/2017  FEV1 1.57 (50%)  Ratio 81    Fleischner Society recommendations for incidental pulmonary nodules will be applied to the CT when available:   1.0 cm or greater > PET,  < 1.0 m follow per guidlelines  He appears to be a good surgical candidate other than his obesity and smoking (see sep a/p)  but apparently did fine with bivalve replacements < 1 year ago   Discussed in detail all the  indications, usual  risks and alternatives  relative to the benefits with patient who agrees to proceed with conservative f/u as outlined    Total time devoted to counseling  > 50 % of initial 60 min office visit:  review case with pt/wife with  discussion of various scenarios depending on size/ number of nodules/  options/alternatives/ personally creating written customized instructions  in presence of pt  then going over those specific  Instructions directly with the pt including how to use all of the meds but in particular covering each new medication in detail and the difference between the maintenance= "automatic" meds and the prns using an action plan format for the latter (If this problem/symptom => do that organization reading Left to right).  Please see AVS from this visit for a full list of these instructions which I personally wrote for this pt and  are unique to this visit.

## 2017-07-14 NOTE — Assessment & Plan Note (Addendum)
>   3 min discussion I reviewed the Fletcher curve with the patient that basically indicates  if you quit smoking when your best day FEV1 is still well preserved (as is quite clearly  the case here with no detectable obst on spiorometry)  it is highly unlikely you will progress to severe disease and informed the patient there was  no medication on the market that has proven to alter the curve/ its downward trajectory  or the likelihood of progression of their disease(unlike other chronic medical conditions such as atheroclerosis where we do think we can change the natural hx with risk reducing meds)    Therefore stopping smoking and maintaining abstinence is the most important aspect of care, not choice of inhalers or for that matter, doctors.

## 2018-07-21 IMAGING — DX DG CHEST 2V
2 series · 2 of 2 positions shown · non-contrast
Comparison: Chest x-ray of August 24, 2005 and August 26, 2004.

CLINICAL DATA: History of CHF, cardiac valve replacement

EXAM:
CHEST  2 VIEW

[chest pa]
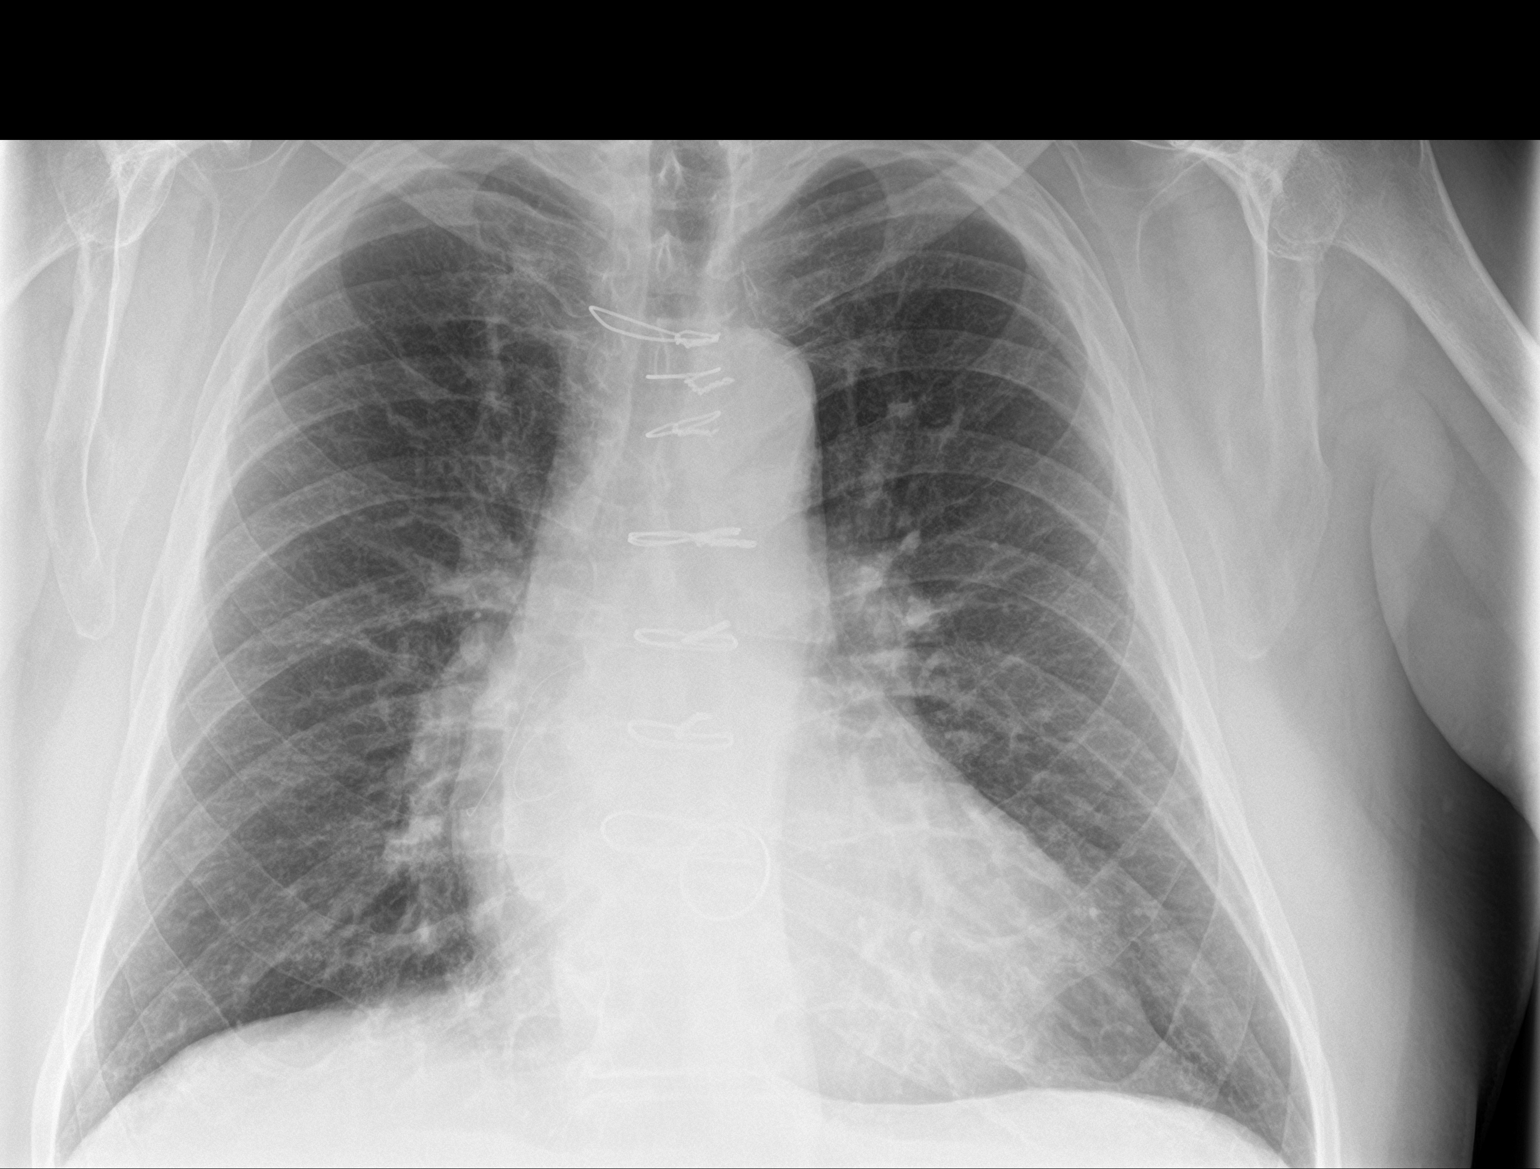

[chest lat]
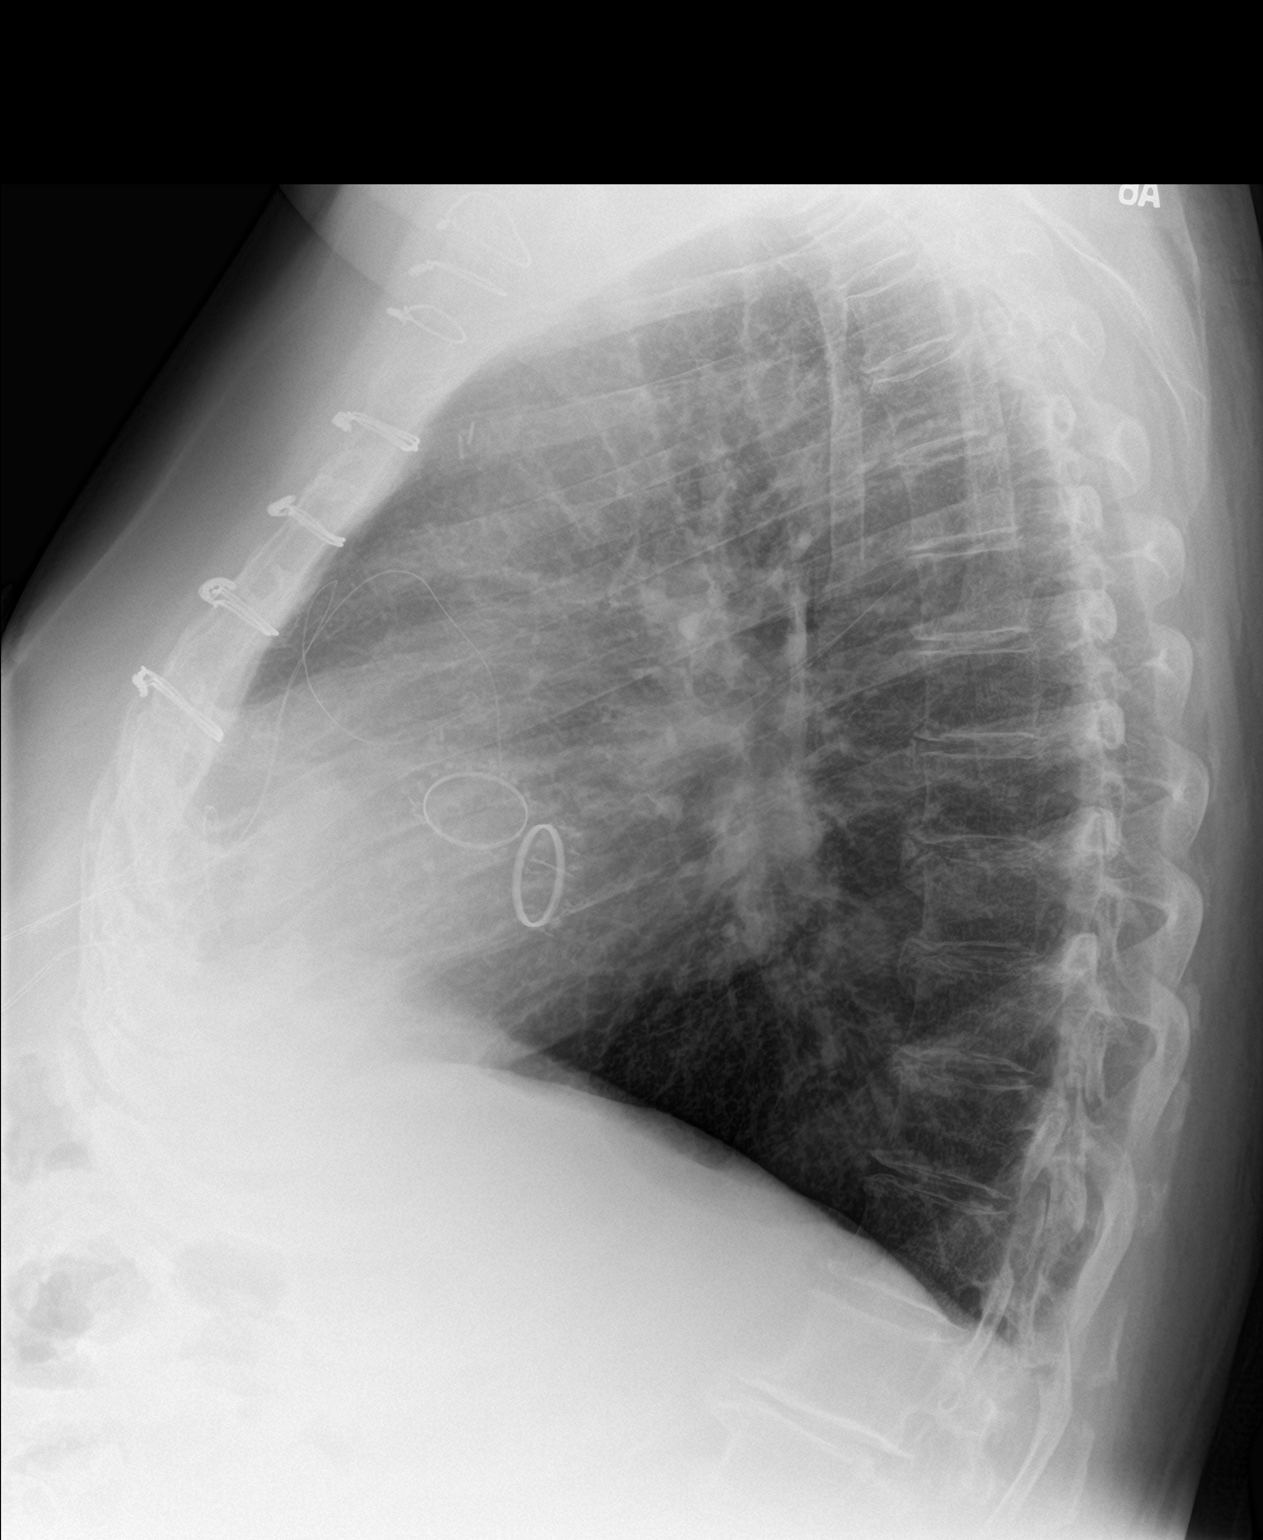

[2 of 2 positions shown; findings below may reference images not displayed]

FINDINGS: The lungs are well-expanded. The interstitial markings are coarse
though stable. The heart is normal in size. The pulmonary
vascularity is not engorged. There are prosthetic valve rings
present likely in the mitral and tricuspid positions. The sternal
wires are intact. There is no pleural effusion. The observed bony
thorax is unremarkable.
IMPRESSION: Mild chronic bronchitic changes, stable.  No overt CHF.

## 2023-01-06 DIAGNOSIS — I639 Cerebral infarction, unspecified: Secondary | ICD-10-CM

## 2023-01-06 HISTORY — DX: Cerebral infarction, unspecified: I63.9

## 2023-01-07 DIAGNOSIS — I4891 Unspecified atrial fibrillation: Secondary | ICD-10-CM | POA: Diagnosis not present

## 2023-01-07 DIAGNOSIS — I517 Cardiomegaly: Secondary | ICD-10-CM | POA: Diagnosis not present

## 2023-02-27 ENCOUNTER — Other Ambulatory Visit: Payer: Self-pay

## 2023-03-06 ENCOUNTER — Ambulatory Visit: Payer: Non-veteran care | Attending: Cardiology | Admitting: Cardiology

## 2023-03-06 ENCOUNTER — Encounter: Payer: Self-pay | Admitting: Cardiology

## 2023-03-06 VITALS — BP 118/72 | HR 78 | Ht 68.0 in | Wt 214.8 lb

## 2023-03-06 DIAGNOSIS — I050 Rheumatic mitral stenosis: Secondary | ICD-10-CM

## 2023-03-06 DIAGNOSIS — G4730 Sleep apnea, unspecified: Secondary | ICD-10-CM | POA: Insufficient documentation

## 2023-03-06 DIAGNOSIS — I619 Nontraumatic intracerebral hemorrhage, unspecified: Secondary | ICD-10-CM

## 2023-03-06 DIAGNOSIS — H34212 Partial retinal artery occlusion, left eye: Secondary | ICD-10-CM

## 2023-03-06 DIAGNOSIS — Z7901 Long term (current) use of anticoagulants: Secondary | ICD-10-CM

## 2023-03-06 DIAGNOSIS — I4891 Unspecified atrial fibrillation: Secondary | ICD-10-CM

## 2023-03-06 DIAGNOSIS — I10 Essential (primary) hypertension: Secondary | ICD-10-CM | POA: Insufficient documentation

## 2023-03-06 DIAGNOSIS — Z7984 Long term (current) use of oral hypoglycemic drugs: Secondary | ICD-10-CM

## 2023-03-06 DIAGNOSIS — G4733 Obstructive sleep apnea (adult) (pediatric): Secondary | ICD-10-CM | POA: Insufficient documentation

## 2023-03-06 DIAGNOSIS — H34232 Retinal artery branch occlusion, left eye: Secondary | ICD-10-CM

## 2023-03-06 DIAGNOSIS — Z952 Presence of prosthetic heart valve: Secondary | ICD-10-CM

## 2023-03-06 DIAGNOSIS — I639 Cerebral infarction, unspecified: Secondary | ICD-10-CM

## 2023-03-06 DIAGNOSIS — H3582 Retinal ischemia: Secondary | ICD-10-CM

## 2023-03-06 DIAGNOSIS — E113299 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 without macular edema, unspecified eye: Secondary | ICD-10-CM

## 2023-03-06 DIAGNOSIS — Z1211 Encounter for screening for malignant neoplasm of colon: Secondary | ICD-10-CM | POA: Insufficient documentation

## 2023-03-06 DIAGNOSIS — K219 Gastro-esophageal reflux disease without esophagitis: Secondary | ICD-10-CM

## 2023-03-06 DIAGNOSIS — I6930 Unspecified sequelae of cerebral infarction: Secondary | ICD-10-CM

## 2023-03-06 DIAGNOSIS — E669 Obesity, unspecified: Secondary | ICD-10-CM | POA: Insufficient documentation

## 2023-03-06 DIAGNOSIS — D649 Anemia, unspecified: Secondary | ICD-10-CM | POA: Insufficient documentation

## 2023-03-06 DIAGNOSIS — E1169 Type 2 diabetes mellitus with other specified complication: Secondary | ICD-10-CM

## 2023-03-06 DIAGNOSIS — H401131 Primary open-angle glaucoma, bilateral, mild stage: Secondary | ICD-10-CM

## 2023-03-06 DIAGNOSIS — F17200 Nicotine dependence, unspecified, uncomplicated: Secondary | ICD-10-CM | POA: Insufficient documentation

## 2023-03-06 DIAGNOSIS — Z461 Encounter for fitting and adjustment of hearing aid: Secondary | ICD-10-CM | POA: Insufficient documentation

## 2023-03-06 DIAGNOSIS — H903 Sensorineural hearing loss, bilateral: Secondary | ICD-10-CM | POA: Insufficient documentation

## 2023-03-06 DIAGNOSIS — I4819 Other persistent atrial fibrillation: Secondary | ICD-10-CM

## 2023-03-06 DIAGNOSIS — E782 Mixed hyperlipidemia: Secondary | ICD-10-CM

## 2023-03-06 HISTORY — DX: Anemia, unspecified: D64.9

## 2023-03-06 HISTORY — DX: Rheumatic mitral stenosis: I05.0

## 2023-03-06 HISTORY — DX: 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 without macular edema, unspecified eye: E11.3299

## 2023-03-06 HISTORY — DX: Encounter for screening for malignant neoplasm of colon: Z12.11

## 2023-03-06 HISTORY — DX: Encounter for fitting and adjustment of hearing aid: Z46.1

## 2023-03-06 HISTORY — DX: Obesity, unspecified: E66.9

## 2023-03-06 HISTORY — DX: Unspecified atrial fibrillation: I48.91

## 2023-03-06 HISTORY — DX: Partial retinal artery occlusion, left eye: H34.212

## 2023-03-06 HISTORY — DX: Unspecified sequelae of cerebral infarction: I69.30

## 2023-03-06 HISTORY — DX: Obstructive sleep apnea (adult) (pediatric): G47.33

## 2023-03-06 HISTORY — DX: Cerebral infarction, unspecified: I63.9

## 2023-03-06 HISTORY — DX: Essential (primary) hypertension: I10

## 2023-03-06 HISTORY — DX: Nicotine dependence, unspecified, uncomplicated: F17.200

## 2023-03-06 HISTORY — DX: Long term (current) use of anticoagulants: Z79.01

## 2023-03-06 HISTORY — DX: Retinal ischemia: H35.82

## 2023-03-06 HISTORY — DX: Gastro-esophageal reflux disease without esophagitis: K21.9

## 2023-03-06 HISTORY — DX: Type 2 diabetes mellitus with other specified complication: E11.69

## 2023-03-06 HISTORY — DX: Primary open-angle glaucoma, bilateral, mild stage: H40.1131

## 2023-03-06 HISTORY — DX: Retinal artery branch occlusion, left eye: H34.232

## 2023-03-06 HISTORY — DX: Presence of prosthetic heart valve: Z95.2

## 2023-03-06 NOTE — Patient Instructions (Signed)
Medication Instructions:  Your physician recommends that you continue on your current medications as directed. Please refer to the Current Medication list given to you today.  *If you need a refill on your cardiac medications before your next appointment, please call your pharmacy*   Lab Work: BMP- INR- today If you have labs (blood work) drawn today and your tests are completely normal, you will receive your results only by: MyChart Message (if you have MyChart) OR A paper copy in the mail If you have any lab test that is abnormal or we need to change your treatment, we will call you to review the results.   Testing/Procedures: None Ordered   Follow-Up: At [facility], you and your health needs are our priority.  As part of our continuing mission to provide you with exceptional heart care, we have created designated Provider Care Teams.  These Care Teams include your primary Cardiologist (physician) and Advanced Practice Providers (APPs -  Physician Assistants and Nurse Practitioners) who all work together to provide you with the care you need, when you need it.  We recommend signing up for the patient portal called "MyChart".  Sign up information is provided on this After Visit Summary.  MyChart is used to connect with patients for Virtual Visits (Telemedicine).  Patients are able to view lab/test results, encounter notes, upcoming appointments, etc.  Non-urgent messages can be sent to your provider as well.   To learn more about what you can do with MyChart, go to [email or web address].    Your next appointment:   1 month(s)  The format for your next appointment:   In Person  Provider:   Gypsy Balsam, MD    Other Instructions NA

## 2023-03-06 NOTE — Progress Notes (Signed)
Cardiology Consultation:    Date:  03/06/2023   ID:  James Paul, DOB 07-12-1946, MRN [id number]  PCP:  Default, Provider, MD  Cardiologist:  Gypsy Balsam, MD   Referring MD: No ref. provider found   Chief Complaint  Patient presents with   Hospitalization Follow-up    [facility] 12/2022    History of Present Illness:    James Paul is a 77 y.o. male who is being seen today for the evaluation of mechanical aortic and mitral valve at the request of No ref. provider found.  Patient is a very complicated gentleman he does have a past medical history significant for mitral and aortic valve replacement with [facility] Jude prosthesis 23 mm for aortic valve and 25 for referral mitral valve, that was done in 2018 at the [place] in [place].  Additional problem include anticoagulation with Coumadin, essential hypertension, hyperlipidemia, gout,, peripheral vascular disease, smoking which was still ongoing when he presented to the hospital in [place] a few weeks ago.  He was there because of fall.  There was also some question about right arm weakness and facial drop.  He did have quite extensive evaluation done by neurology there were multiple small acute infarcts present to the left posterior frontal and parietal lobes.  He was put on higher dose of statin, his INR was supratherapeutic at that time.  Since that time we tried to regulate his INR however he had difficulty tolerating and controlling his INR.  The worst is the fact that he have to drive every single time for Coumadin check to [place] in [place] which is about 50 miles for him.  He has been having his INR checked twice a week with the last few weeks.  He comes today to my office he went to be established as a patient.  He is doing quite well he does have compression fracture of fused vertebrae on the back.  He is wearing or set but he said it helps him a lot he is doing a bit better.  Also use cane.  Denies have any chest pain tightness squeezing  pressure burning chest.  He is not sure exactly why he is about where it replaced his wife who is with him in the office and participate in decision-making telling me that he is in follow-up for now I suspect rheumatic fever may play some role here.  Since the time of surgery him seems to be doing quite well until recent episodes that brought him to hospital.  He was find to be in atrial fibrillation but I do not know duration of this phenomenon.  He denies have any palpitations in the history.  Past Medical History:  Diagnosis Date   Cataract    B/L early cataracts   CHF (congestive heart failure) (HCC)    Diabetes mellitus without complication (HCC)    GERD (gastroesophageal reflux disease)    PMH   Heart murmur    Hypercholesterolemia    Hypertension    Seasonal allergies    Sleep apnea    Wears glasses     Past Surgical History:  Procedure Laterality Date   BACK SURGERY     COLONOSCOPY     HERNIA REPAIR     PAROTIDECTOMY Left 08/08/2014   Procedure: PAROTIDECTOMY;  Surgeon: Suzanna Obey, MD;  Location: [facility] OR;  Service: ENT;  Laterality: Left;    Current Medications: Current Meds  Medication Sig   amLODipine (NORVASC) 10 MG tablet Take 5 mg by  mouth daily.   aspirin EC 81 MG tablet Take 81 mg by mouth daily.   atenolol (TENORMIN) 25 MG tablet Take 25 mg by mouth daily.   atorvastatin (LIPITOR) 40 MG tablet Take 20 mg by mouth daily.   furosemide (LASIX) 40 MG tablet Take 40 mg by mouth daily.   lisinopril (PRINIVIL,ZESTRIL) 10 MG tablet Take 5 mg by mouth daily.   metFORMIN (GLUCOPHAGE) 500 MG tablet Take 500 mg by mouth daily.   omeprazole (PRILOSEC) 20 MG capsule Take 20 mg by mouth daily.   potassium chloride SA (K-DUR,KLOR-CON) 20 MEQ tablet Take 20 mEq by mouth daily.   warfarin (COUMADIN) 5 MG tablet Take 5 mg by mouth as directed.     Allergies:   Patient has no known allergies.   Social History   Socioeconomic History   Marital status: Single    Spouse name:  Not on file   Number of children: Not on file   Years of education: Not on file   Highest education level: Not on file  Occupational History   Not on file  Tobacco Use   Smoking status: Every Day    Packs/day: .5    Types: Cigarettes   Smokeless tobacco: Never  Substance and Sexual Activity   Alcohol use: Yes    Comment: Rare beer   Drug use: No   Sexual activity: Not on file  Other Topics Concern   Not on file  Social History Narrative   Not on file   Social Determinants of Health   Financial Resource Strain: Not on file  Food Insecurity: Not on file  Transportation Needs: Not on file  Physical Activity: Not on file  Stress: Not on file  Social Connections: Not on file     Family History: The patient's family history includes Aneurysm in his father; Congestive Heart Failure in his mother and sister. ROS:   Please see the history of present illness.    All 14 point review of systems negative except as described per history of present illness.  EKGs/Labs/Other Studies Reviewed:    The following studies were reviewed today: Echocardiogram done in the hospital showed ejection fraction 4045%, his aortic valve was assessed and functioning properly, peak gradient 15 mm mean 7 mmHg, aortic valve area 1.2 dementia index 0.4.  Mitral valve mean gradient was 3 mmHg.  Normal functioning valve.  Interesting left atrium was normal in size.  EKG:  EKG is  ordered today.  The ekg ordered today demonstrates atrial fibrillation with controlled ventricular rate, low voltage EKG, no ST segment changes  Recent Labs: No results found for requested labs within last 365 days.  Recent Lipid Panel No results found for: "CHOL", "TRIG", "HDL", "CHOLHDL", "VLDL", "LDLCALC", "LDLDIRECT"  Physical Exam:    VS:  BP 118/72 (BP Location: Left Arm, Patient Position: Sitting)   Pulse 78   Ht 5\' 8"  (1.727 m)   Wt 214 lb 12.8 oz (97.4 kg)   SpO2 98%   BMI 32.66 kg/m     Wt Readings from Last 3  Encounters:  03/06/23 214 lb 12.8 oz (97.4 kg)  07/13/17 230 lb 6 oz (104.5 kg)  08/08/14 231 lb (104.8 kg)     GEN:  Well nourished, well developed in no acute distress HEENT: Normal NECK: No JVD; No carotid bruits LYMPHATICS: No lymphadenopathy CARDIAC: Irregularly irregular, n crisp's heart sounds, systolic murmur grade 1/6 to 2/6 best heard left border sternum, no rubs, no gallops RESPIRATORY:  Clear  to auscultation without rales, wheezing or rhonchi  ABDOMEN: Soft, non-tender, non-distended MUSCULOSKELETAL:  No edema; No deformity  SKIN: Warm and dry NEUROLOGIC:  Alert and oriented x 3 PSYCHIATRIC:  Normal affect   ASSESSMENT:    1. Essential hypertension   2. 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 Late effect of cerebrovascular accident (CVA)   5. Persistent atrial fibrillation (HCC)   6. 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 mellitus (HCC)    PLAN:    In order of problems listed above:  History of CVA.  It looks like there is high fluctuation of INR.  He have to travel 50 miles at the [place] to have his INR checked, I will try to switch him to our INR clinic he is INR should be Between 2.5 and 3.5 because of high risk scenario he does have mechanical valve even though it is less thrombogenic stJude but the mitral valve position it is a high thrombogenic scenario, he also have atrial fibrillation.  Therefore, INR should be Between 2.5 and 3.5.  I Will Refer Him to Neurology There Was Some Suggestion of Doing TEE, However I Do Not See Heart Push for That since I Think the Reason for His CVA and Stroke Is the Fact That He Does Have Supratherapeutic, Highly Fluctuating INR I Think the Efforts Should Concentrate on Controlling His INR to Best Possible.  I did review test from the hospital including CT angio of his carotic arteries did not show any critical lesions. Cardiomyopathy  with ejection fraction 40 to 45%, Chem-7 will be done today and then hopefully will be able to start him on Entresto. Atrial fibrillation duration of this phenomenon is unclear.  The fact that he got normal atrial size on the echocardiogram surprisingly indicated that he does have only paroxysmal atrial fibrillation.  I will get records from [facility] cardiologist to see if there is any notification of prior atrial fibrillation and duration of this phenomenon. Dyslipidemia I do not have fasting lipid profile make arrangements for test to be done.  Overall is a very complex case I spent at least 1 hour reviewing his records we will request records also from [place].  Will see him within next few weeks   Medication Adjustments/Labs and Tests Ordered: Current medicines are reviewed at length with the patient today.  Concerns regarding medicines are outlined above.  Orders Placed This Encounter  Procedures   Basic metabolic panel   Protime-INR   Ambulatory referral to Neurology   EKG 12-Lead   No orders of the defined types were placed in this encounter.   Signed, Georgeanna Lea, MD, [facility]. 03/06/2023 12:09 PM    [facility]

## 2023-03-07 ENCOUNTER — Telehealth: Payer: Self-pay

## 2023-03-07 DIAGNOSIS — Z952 Presence of prosthetic heart valve: Secondary | ICD-10-CM

## 2023-03-07 LAB — BASIC METABOLIC PANEL WITH GFR
BUN/Creatinine Ratio: 14 (ref 10–24)
BUN: 11 mg/dL (ref 8–27)
CO2: 24 mmol/L (ref 20–29)
Calcium: 9.5 mg/dL (ref 8.6–10.2)
Chloride: 102 mmol/L (ref 96–106)
Creatinine, Ser: 0.77 mg/dL (ref 0.76–1.27)
Glucose: 160 mg/dL — ABNORMAL HIGH (ref 70–99)
Potassium: 4.5 mmol/L (ref 3.5–5.2)
Sodium: 139 mmol/L (ref 134–144)
eGFR: 93 mL/min/1.73

## 2023-03-07 NOTE — Telephone Encounter (Signed)
Pt's wife Erskine Squibb returned call. Stated pt's most recent INR was 3.7 on 03/03/23 and dose was decreased to 5mg  daily. I made Erskine Squibb aware that our Coumadin Clinic will check INR on Tuesday, June 18th to determine if this dose is appropriate. Also educated on the importance of consistency with diet (greens) and medications. Scheduled appt for 03/14/23 at 2:30pm. Pt's wife verbalized understanding.

## 2023-03-07 NOTE — Telephone Encounter (Signed)
Neena Rhymes, RN  Memory Dance, RN Can we get pt started in Coumadin clinic please? Having INR drawn today. Thank you! Dede   Received message above. Called patient to provide him with information on  [facility] in [place] and schedule pt an appt next week to have INR checked. No answer, left detailed message with call back number.    Dx: Mechanical Aortic & Mitral Valve Replacement, Afib, CVA  Goal: 2.5 - 3.5

## 2023-03-09 ENCOUNTER — Telehealth: Payer: Self-pay

## 2023-03-09 DIAGNOSIS — Z79899 Other long term (current) drug therapy: Secondary | ICD-10-CM

## 2023-03-09 MED ORDER — ENTRESTO 24-26 MG PO TABS: 1.0000 | ORAL_TABLET | Freq: Two times a day (BID) | ORAL | 2 refills | Status: DC

## 2023-03-09 NOTE — Telephone Encounter (Signed)
Spoke with Erskine Squibb, notified of results and recommendations and agreed with plan. Rx sent. Lab order on file. I did confirmed patient is not taking Losartan or Lisinopril. Med list updated.

## 2023-03-09 NOTE — Telephone Encounter (Signed)
-----   Message from Georgeanna Lea, MD sent at 03/08/2023  1:14 PM EDT ----- Chem-7 looks good, please start Entresto 24-26 twice daily, Chem-7 need to be repeated in 1 week

## 2023-03-14 ENCOUNTER — Ambulatory Visit: Payer: Non-veteran care | Attending: Cardiology

## 2023-03-14 DIAGNOSIS — Z952 Presence of prosthetic heart valve: Secondary | ICD-10-CM | POA: Diagnosis not present

## 2023-03-14 DIAGNOSIS — Z7901 Long term (current) use of anticoagulants: Secondary | ICD-10-CM | POA: Diagnosis not present

## 2023-03-14 DIAGNOSIS — I4819 Other persistent atrial fibrillation: Secondary | ICD-10-CM

## 2023-03-14 HISTORY — DX: Presence of prosthetic heart valve: Z95.2

## 2023-03-14 LAB — POCT INR: INR: 5.1 — AB (ref 2.0–3.0)

## 2023-03-14 NOTE — Patient Instructions (Addendum)
Description   HOLD today's dose and 1/2 tablets tomorrow and and then START taking 1 tablet daily EXCEPT 1/2 tablet on Sundays.  Stay consistent with greens each week (1-2 servings per week)  Coumadin Clinic [phone]      A full discussion of the nature of anticoagulants has been carried out.  A benefit risk analysis has been presented to the patient, so that they understand the justification for choosing anticoagulation at this time. The need for frequent and regular monitoring, precise dosage adjustment and compliance is stressed.  Side effects of potential bleeding are discussed.  The patient should avoid any OTC items containing aspirin or ibuprofen, and should avoid great swings in general diet.  Avoid alcohol consumption.  Call if any signs of abnormal bleeding.

## 2023-03-16 NOTE — Progress Notes (Signed)
NEUROLOGY CONSULTATION NOTE  [name] MRN: [id number] DOB: Sep 21, 1946  Referring provider: Gypsy Balsam, MD   Reason for consult:  stroke  Assessment/Plan:   Small acute left hemispheric infarcts involving the left MCA and PCA territories, likely embolic in setting of labile INR Atrial fibrillation S/p mechanical aortic and mitral valve Hyperlipidemia   Secondary stroke prevention as per cardiology/PCP: Warfarin (INR 2.5-3.5) Statin.  LDL goal less than 70 Normotensive blood pressure - low today (asymptomatic).  Advised to recheck at home and contact PCP or cardiology Hgb A1c goal less than 7 Follow up 6 months.   Subjective:  James Paul is a 77 year old male with cardiomyopathy, mitral and aortic valve replacement and a fib on warfarin, PVD, HTN, HLD and sleep apnea who presents for stroke.  History supplemented by hospital records and referring provider's notes.  He is accompanied by his wife.     On 01/06/2023, he developed right sided weakness with right facial droop.  A couple of months prior, he had a couple of brief episodes of right arm weakness but nothing as severe as this.  He was admitted to [facility] CT head negative for acute stroke.  CTA head and neck revealed no LVO or hemodynamically significant stenosis.  INR was found to be greater than 10, so he was not a t-PA candidate.  Treated with IV vitamin K.  MRI of brain showed multiple small acute infarcts in the left posterior frontal and parietal lobes as well as tiny acute or early subacute infarct in the left occipital lobe and possibly the superior left thalamus.  2D echo on 01/07/2023 revealed EF 40-45% s/p aortic valve repair with no evidence of aortic stenosis.  LDL was 74 and A1c was 7.0%.  He has had a difficult time regulating his INR which ranges from subtherapeutic to supratherapeutic levels (1.6 to over 10).  He had PT/OT and has seen significant improvement in right upper extremity  strength but still notes some residual weakness.     PAST MEDICAL HISTORY: Past Medical History:  Diagnosis Date   Cataract    B/L early cataracts   CHF (congestive heart failure) (HCC)    Diabetes mellitus without complication (HCC)    GERD (gastroesophageal reflux disease)    PMH   Heart murmur    Hypercholesterolemia    Hypertension    Seasonal allergies    Sleep apnea    Wears glasses     PAST SURGICAL HISTORY: Past Surgical History:  Procedure Laterality Date   BACK SURGERY     COLONOSCOPY     HERNIA REPAIR     PAROTIDECTOMY Left 08/08/2014   Procedure: PAROTIDECTOMY;  Surgeon: Suzanna Obey, MD;  Location: [facility] OR;  Service: ENT;  Laterality: Left;    MEDICATIONS: Current Outpatient Medications on File Prior to Visit  Medication Sig Dispense Refill   amLODipine (NORVASC) 10 MG tablet Take 5 mg by mouth daily.     aspirin EC 81 MG tablet Take 81 mg by mouth daily.     atenolol (TENORMIN) 25 MG tablet Take 25 mg by mouth daily.     atorvastatin (LIPITOR) 40 MG tablet Take 20 mg by mouth daily.     furosemide (LASIX) 40 MG tablet Take 40 mg by mouth daily.     metFORMIN (GLUCOPHAGE) 500 MG tablet Take 500 mg by mouth daily.     omeprazole (PRILOSEC) 20 MG capsule Take 20 mg by mouth daily.     potassium  chloride SA (K-DUR,KLOR-CON) 20 MEQ tablet Take 20 mEq by mouth daily.     sacubitril-valsartan (ENTRESTO) 24-26 MG Take 1 tablet by mouth 2 (two) times daily. 60 tablet 2   warfarin (COUMADIN) 5 MG tablet Take 5 mg by mouth as directed.     No current facility-administered medications on file prior to visit.    ALLERGIES: No Known Allergies  FAMILY HISTORY: Family History  Problem Relation Age of Onset   Congestive Heart Failure Mother    Aneurysm Father    Congestive Heart Failure Sister     Objective:  Blood pressure (!) 95/56, pulse 63, height 5\' 9"  (1.753 m), weight 213 lb 12.8 oz (97 kg), SpO2 93 %. General: No acute distress.  Patient appears  well-groomed.   Head:  Normocephalic/atraumatic Eyes:  fundi examined but not visualized Neck: supple, no paraspinal tenderness, full range of motion Back: No paraspinal tenderness Heart: irregular rate and irregular rhythm Lungs: Clear to auscultation bilaterally. Vascular: No carotid bruits. Neurological Exam: Mental status: alert and oriented to person, place, and time, speech fluent and not dysarthric, language intact. Cranial nerves: CN I: not tested CN II: pupils equal, round and reactive to light, visual fields intact CN III, IV, VI:  full range of motion, no nystagmus, no ptosis CN V: facial sensation intact. CN VII: upper and lower face symmetric CN VIII: hearing intact CN IX, X: gag intact, uvula midline CN XI: sternocleidomastoid and trapezius muscles intact CN XII: tongue midline Bulk & Tone: normal, no fasciculations. Motor:  muscle strength 5-/5 right deltoid and grip, reduced finger-thumb speed and amplitude on right; otherwise, 5/5 throughout Sensation:  temperature sensation intact; vibratory sensation reduced in left foot Deep Tendon Reflexes:  2+ throughout,  right Babinski sign, absent on left. Finger to nose testing:  Without dysmetria.   Heel to shin:  Without dysmetria.   Gait:  Antalgic gait.  Uses walker.  Romberg negative.    Thank you for allowing me to take part in the care of this patient.  Shon Millet, DO  CC: Gypsy Balsam, MD

## 2023-03-17 ENCOUNTER — Encounter: Payer: Self-pay | Admitting: Neurology

## 2023-03-17 ENCOUNTER — Ambulatory Visit (INDEPENDENT_AMBULATORY_CARE_PROVIDER_SITE_OTHER): Payer: Medicare PPO | Admitting: Neurology

## 2023-03-17 ENCOUNTER — Telehealth: Payer: Self-pay | Admitting: Cardiology

## 2023-03-17 VITALS — BP 95/56 | HR 63 | Ht 69.0 in | Wt 213.8 lb

## 2023-03-17 DIAGNOSIS — I639 Cerebral infarction, unspecified: Secondary | ICD-10-CM

## 2023-03-17 DIAGNOSIS — E782 Mixed hyperlipidemia: Secondary | ICD-10-CM

## 2023-03-17 DIAGNOSIS — E1169 Type 2 diabetes mellitus with other specified complication: Secondary | ICD-10-CM | POA: Diagnosis not present

## 2023-03-17 DIAGNOSIS — I10 Essential (primary) hypertension: Secondary | ICD-10-CM | POA: Diagnosis not present

## 2023-03-17 DIAGNOSIS — I4819 Other persistent atrial fibrillation: Secondary | ICD-10-CM | POA: Diagnosis not present

## 2023-03-17 NOTE — Telephone Encounter (Signed)
Came in for routine lab work after starting Entresto, BP has been low 90/60 at home and he is symptomatic. Will stop his Amlodipine.

## 2023-03-17 NOTE — Patient Instructions (Signed)
Continue current management

## 2023-03-18 LAB — BASIC METABOLIC PANEL
BUN/Creatinine Ratio: 13 (ref 10–24)
BUN: 14 mg/dL (ref 8–27)
CO2: 23 mmol/L (ref 20–29)
Calcium: 9.8 mg/dL (ref 8.6–10.2)
Chloride: 101 mmol/L (ref 96–106)
Creatinine, Ser: 1.05 mg/dL (ref 0.76–1.27)
Glucose: 205 mg/dL — ABNORMAL HIGH (ref 70–99)
Potassium: 4.1 mmol/L (ref 3.5–5.2)
Sodium: 138 mmol/L (ref 134–144)
eGFR: 74 mL/min/{1.73_m2} (ref >59)

## 2023-03-20 ENCOUNTER — Telehealth: Payer: Self-pay

## 2023-03-20 NOTE — Telephone Encounter (Signed)
Results reviewed with pt's spouse as per Dr. Krasowski's note.  Spouse verbalized understanding and had no additional questions. Routed to PCP 

## 2023-03-21 ENCOUNTER — Ambulatory Visit: Payer: Non-veteran care | Attending: Cardiology

## 2023-03-21 DIAGNOSIS — Z952 Presence of prosthetic heart valve: Secondary | ICD-10-CM | POA: Diagnosis not present

## 2023-03-21 DIAGNOSIS — Z7901 Long term (current) use of anticoagulants: Secondary | ICD-10-CM | POA: Diagnosis not present

## 2023-03-21 LAB — POCT INR: INR: 2.9 (ref 2.0–3.0)

## 2023-03-21 NOTE — Patient Instructions (Signed)
Description   Continue taking 1 tablet daily EXCEPT 1/2 tablet on Sundays.  Stay consistent with greens each week (1-2 servings per week)  Coumadin Clinic [phone]

## 2023-03-28 ENCOUNTER — Ambulatory Visit: Payer: Non-veteran care | Attending: Cardiology

## 2023-03-28 DIAGNOSIS — Z952 Presence of prosthetic heart valve: Secondary | ICD-10-CM

## 2023-03-28 DIAGNOSIS — Z5181 Encounter for therapeutic drug level monitoring: Secondary | ICD-10-CM

## 2023-03-28 LAB — POCT INR: INR: 3.7 — AB (ref 2.0–3.0)

## 2023-03-28 NOTE — Patient Instructions (Signed)
Description   Eat greens today and START taking 1 tablet daily EXCEPT 1/2 tablet on Sundays and Thursdays.  Stay consistent with greens each week (1-2 servings per week)  Coumadin Clinic [phone]

## 2023-04-04 ENCOUNTER — Ambulatory Visit: Payer: Non-veteran care | Attending: Cardiology

## 2023-04-04 DIAGNOSIS — Z5181 Encounter for therapeutic drug level monitoring: Secondary | ICD-10-CM | POA: Diagnosis not present

## 2023-04-04 DIAGNOSIS — Z952 Presence of prosthetic heart valve: Secondary | ICD-10-CM | POA: Diagnosis not present

## 2023-04-04 LAB — POCT INR: INR: 3.7 — AB (ref 2.0–3.0)

## 2023-04-04 NOTE — Patient Instructions (Signed)
Description   START taking 1 tablet daily EXCEPT 1/2 tablet on Sundays, Tuesdays, and Thursdays.  Stay consistent with greens each week (1-2 servings per week)  Recheck INR in 1 week  Coumadin Clinic [phone]

## 2023-04-11 ENCOUNTER — Encounter: Payer: Self-pay | Admitting: Cardiology

## 2023-04-11 ENCOUNTER — Ambulatory Visit: Payer: No Typology Code available for payment source | Attending: Cardiology | Admitting: Cardiology

## 2023-04-11 ENCOUNTER — Telehealth: Payer: Self-pay | Admitting: Cardiology

## 2023-04-11 ENCOUNTER — Ambulatory Visit (INDEPENDENT_AMBULATORY_CARE_PROVIDER_SITE_OTHER): Payer: No Typology Code available for payment source

## 2023-04-11 ENCOUNTER — Ambulatory Visit: Payer: No Typology Code available for payment source

## 2023-04-11 VITALS — BP 126/70 | HR 97 | Ht 69.0 in | Wt 212.4 lb

## 2023-04-11 DIAGNOSIS — I342 Nonrheumatic mitral (valve) stenosis: Secondary | ICD-10-CM | POA: Diagnosis not present

## 2023-04-11 DIAGNOSIS — I10 Essential (primary) hypertension: Secondary | ICD-10-CM | POA: Diagnosis not present

## 2023-04-11 DIAGNOSIS — I4819 Other persistent atrial fibrillation: Secondary | ICD-10-CM | POA: Diagnosis not present

## 2023-04-11 DIAGNOSIS — Z7984 Long term (current) use of oral hypoglycemic drugs: Secondary | ICD-10-CM

## 2023-04-11 DIAGNOSIS — Z952 Presence of prosthetic heart valve: Secondary | ICD-10-CM | POA: Diagnosis not present

## 2023-04-11 DIAGNOSIS — E113299 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 without macular edema, unspecified eye: Secondary | ICD-10-CM | POA: Diagnosis not present

## 2023-04-11 DIAGNOSIS — Z5181 Encounter for therapeutic drug level monitoring: Secondary | ICD-10-CM

## 2023-04-11 DIAGNOSIS — I6930 Unspecified sequelae of cerebral infarction: Secondary | ICD-10-CM

## 2023-04-11 LAB — POCT INR: INR: 2.9 (ref 2.0–3.0)

## 2023-04-11 MED ORDER — METOPROLOL TARTRATE 25 MG PO TABS: 25.0000 mg | ORAL_TABLET | Freq: Two times a day (BID) | ORAL | 2 refills | Status: DC

## 2023-04-11 MED ORDER — METOPROLOL TARTRATE 50 MG PO TABS: 50.0000 mg | ORAL_TABLET | Freq: Two times a day (BID) | ORAL | 3 refills | Status: DC

## 2023-04-11 NOTE — Patient Instructions (Signed)
Description   Continue taking 1 tablet daily EXCEPT 1/2 tablet on Sundays, Tuesdays, and Thursdays.  Stay consistent with greens each week (2 servings per week)  Recheck INR in 2 weeks  Coumadin Clinic [phone] Cardiac Clearance Fax #[phone]

## 2023-04-11 NOTE — Patient Instructions (Addendum)
Medication Instructions:   INCREASE: Metoprolol to 50mg  twice daily   Lab Work: CBC, BMP-today If you have labs (blood work) drawn today and your tests are completely normal, you will receive your results only by: MyChart Message (if you have MyChart) OR A paper copy in the mail If you have any lab test that is abnormal or we need to change your treatment, we will call you to review the results.   Testing/Procedures: None Ordered   Follow-Up: At [facility], you and your health needs are our priority.  As part of our continuing mission to provide you with exceptional heart care, we have created designated Provider Care Teams.  These Care Teams include your primary Cardiologist (physician) and Advanced Practice Providers (APPs -  Physician Assistants and Nurse Practitioners) who all work together to provide you with the care you need, when you need it.  We recommend signing up for the patient portal called "MyChart".  Sign up information is provided on this After Visit Summary.  MyChart is used to connect with patients for Virtual Visits (Telemedicine).  Patients are able to view lab/test results, encounter notes, upcoming appointments, etc.  Non-urgent messages can be sent to your provider as well.   To learn more about what you can do with MyChart, go to [email or web address].    Your next appointment:   2 month(s)- First of September  The format for your next appointment:   In Person  Provider:   Gypsy Balsam, MD    Other Instructions NA

## 2023-04-11 NOTE — Progress Notes (Signed)
Cardiology Office Note:    Date:  04/11/2023   ID:  James Paul, DOB [birth year], MRN [id number]  PCP:  Default, Provider, MD  Cardiologist:  Gypsy Balsam, MD    Referring MD: James Lea, MD   Chief Complaint  Patient presents with   Medication Refill    Metoprolol Tartrate 25 bid    History of Present Illness:    James Paul is a 77 y.o. male very complex past medical history status post mitral and aortic valve replacement with [facility] Jude prosthesis 23 mm in aortic position 25 mm of mitral valve position that was done in 2019 in the [place] in [place].  Additional problem include anticoagulation with Coumadin, essential hypertension, hyperlipidemia, gout, peripheral vascular disease, smoker which is still ongoing, recently presented to hospital with CVA.  It was felt to be related f to high fluctuation of INR.  Since that time he is being followed by our clinic and has better control.  Additional issue is atrial fibrillation.  He is overall seems to be doing well.  Denies have any chest pain tightness squeezing pressure burning chest.  Past Medical History:  Diagnosis Date   Cataract    B/L early cataracts   CHF (congestive heart failure) (HCC)    Diabetes mellitus without complication (HCC)    GERD (gastroesophageal reflux disease)    PMH   Heart murmur    Hypercholesterolemia    Hypertension    Seasonal allergies    Sleep apnea    Wears glasses     Past Surgical History:  Procedure Laterality Date   BACK SURGERY     COLONOSCOPY     HERNIA REPAIR     PAROTIDECTOMY Left 08/08/2014   Procedure: PAROTIDECTOMY;  Surgeon: Suzanna Obey, MD;  Location: [facility] OR;  Service: ENT;  Laterality: Left;    Current Medications: Current Meds  Medication Sig   amLODipine (NORVASC) 10 MG tablet Take 5 mg by mouth daily.   atorvastatin (LIPITOR) 40 MG tablet Take 20 mg by mouth daily.   metFORMIN (GLUCOPHAGE) 500 MG tablet Take 500 mg by mouth daily.   metoprolol tartrate  (LOPRESSOR) 25 MG tablet Take 25 mg by mouth 2 (two) times daily.   sacubitril-valsartan (ENTRESTO) 24-26 MG Take 1 tablet by mouth 2 (two) times daily.   warfarin (COUMADIN) 5 MG tablet Take 5 mg by mouth as directed.   [DISCONTINUED] potassium chloride SA (K-DUR,KLOR-CON) 20 MEQ tablet Take 20 mEq by mouth daily.     Allergies:   Patient has no known allergies.   Social History   Socioeconomic History   Marital status: Single    Spouse name: Not on file   Number of children: Not on file   Years of education: Not on file   Highest education level: Not on file  Occupational History   Not on file  Tobacco Use   Smoking status: Every Day    Current packs/day: 0.50    Types: Cigarettes   Smokeless tobacco: Never  Substance and Sexual Activity   Alcohol use: Yes    Comment: Rare beer   Drug use: No   Sexual activity: Not on file  Other Topics Concern   Not on file  Social History Narrative   Not on file   Social Determinants of Health   Financial Resource Strain: Not on file  Food Insecurity: Not on file  Transportation Needs: Not on file  Physical Activity: Not on file  Stress: Not on file  Social Connections: Not on file     Family History: The patient's family history includes Aneurysm in his father; Congestive Heart Failure in his mother and sister. ROS:   Please see the history of present illness.    All 14 point review of systems negative except as described per history of present illness  EKGs/Labs/Other Studies Reviewed:         Recent Labs: 03/17/2023: BUN 14; Creatinine, Ser 1.05; Potassium 4.1; Sodium 138  Recent Lipid Panel No results found for: "CHOL", "TRIG", "HDL", "CHOLHDL", "VLDL", "LDLCALC", "LDLDIRECT"  Physical Exam:    VS:  BP 126/70 (BP Location: Left Arm, Patient Position: Sitting)   Pulse 97   Ht 5\' 9"  (1.753 m)   Wt 212 lb 6.4 oz (96.3 kg)   SpO2 95%   BMI 31.37 kg/m     Wt Readings from Last 3 Encounters:  04/11/23 212 lb 6.4  oz (96.3 kg)  03/17/23 213 lb 12.8 oz (97 kg)  03/06/23 214 lb 12.8 oz (97.4 kg)     GEN:  Well nourished, well developed in no acute distress HEENT: Normal NECK: No JVD; No carotid bruits LYMPHATICS: No lymphadenopathy CARDIAC: Irregularly irregular, mechanical valve sounds clearly heard, no rubs, no gallops RESPIRATORY:  Clear to auscultation without rales, wheezing or rhonchi  ABDOMEN: Soft, non-tender, non-distended MUSCULOSKELETAL:  No edema; No deformity  SKIN: Warm and dry LOWER EXTREMITIES: no swelling NEUROLOGIC:  Alert and oriented x 3 PSYCHIATRIC:  Normal affect   ASSESSMENT:    1. Persistent atrial fibrillation (HCC)   2. Essential hypertension   3. Nonrheumatic mitral valve stenosis   4. 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 retinopathy without macular edema, unspecified laterality, unspecified whether long term insulin use (HCC)   5. H/O mitral valve replacement 25 mm changes done at [facility] 2018   6. Late effect of cerebrovascular accident (CVA)   7. 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  I prefer him to be anticoagulated for a few months before attempting cardioversion.  Also when he will require cardioversion probably best approach will be to do TEE and cardioversion.  Until then we will continue anticoagulation as well as AV blockade.  His heart rate is excessive today, will get EKG.  Most likely increase dose of metoprolol to 50 mg twice daily. Essential hypertension blood pressure well-controlled continue present management. 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  He is on Entresto 24-26 and today almost like will increase dose of metoprolol. Essential hypertension gradually increasing medications to improve his cardiomyopathy.  Anticipate need to discontinue his amlodipine in order to be  able to increase dose of Entresto. He will require most likely ENT surgery.  I will see him in September we will decide about that at that time.  I prefer him to be 6 months after his CVA.   Medication Adjustments/Labs and Tests Ordered: Current medicines are reviewed at length with the patient today.  Concerns regarding medicines are outlined above.  No orders of the defined types were placed in this encounter.  Medication changes: No orders of the defined types were placed in this encounter.   Signed, James Lea, MD, [facility] 04/11/2023 1:17 PM    [facility]

## 2023-04-11 NOTE — Telephone Encounter (Signed)
Spoke with pt's spouse. She stated that the pt drives a bus for [employer] and would like a note releasing him to go back to work. Dr. Bing Matter stated that he would prefer he wait until we get his heart rate issue controlled before releasing him to work. Spouse verbalized understanding and had no further questions.

## 2023-04-11 NOTE — Telephone Encounter (Signed)
Patient would like to pick up a letter stating that it will be okay to return to work. Requesting call back to discuss.

## 2023-04-12 LAB — CBC
Hematocrit: 33.9 % — ABNORMAL LOW (ref 37.5–51.0)
Hemoglobin: 11.2 g/dL — ABNORMAL LOW (ref 13.0–17.7)
MCH: 29.9 pg (ref 26.6–33.0)
MCHC: 33 g/dL (ref 31.5–35.7)
MCV: 90 fL (ref 79–97)
Platelets: 124 10*3/uL — ABNORMAL LOW (ref 150–450)
RBC: 3.75 x10E6/uL — ABNORMAL LOW (ref 4.14–5.80)
RDW: 15.2 % (ref 11.6–15.4)
WBC: 6.7 10*3/uL (ref 3.4–10.8)

## 2023-04-12 LAB — BASIC METABOLIC PANEL
BUN/Creatinine Ratio: 10 (ref 10–24)
BUN: 9 mg/dL (ref 8–27)
CO2: 21 mmol/L (ref 20–29)
Calcium: 9.5 mg/dL (ref 8.6–10.2)
Chloride: 104 mmol/L (ref 96–106)
Creatinine, Ser: 0.9 mg/dL (ref 0.76–1.27)
Glucose: 161 mg/dL — ABNORMAL HIGH (ref 70–99)
Potassium: 4 mmol/L (ref 3.5–5.2)
Sodium: 141 mmol/L (ref 134–144)
eGFR: 89 mL/min/{1.73_m2} (ref >59)

## 2023-04-20 ENCOUNTER — Telehealth: Payer: Self-pay

## 2023-04-20 DIAGNOSIS — I10 Essential (primary) hypertension: Secondary | ICD-10-CM

## 2023-04-20 NOTE — Telephone Encounter (Signed)
Called to speak with pt about results. Spoke with spouse per DPR. She stated that the pt was already on Metoprolol 50mg  1 tablet twice daily. Will sent to [facility] for further direction before increasing Entresto to 49/51 BID.

## 2023-04-24 ENCOUNTER — Other Ambulatory Visit: Payer: Self-pay

## 2023-04-25 ENCOUNTER — Ambulatory Visit: Payer: No Typology Code available for payment source | Attending: Cardiology

## 2023-04-25 DIAGNOSIS — Z952 Presence of prosthetic heart valve: Secondary | ICD-10-CM | POA: Diagnosis not present

## 2023-04-25 DIAGNOSIS — Z5181 Encounter for therapeutic drug level monitoring: Secondary | ICD-10-CM

## 2023-04-25 LAB — POCT INR: INR: 2.9 (ref 2.0–3.0)

## 2023-04-25 MED ORDER — ENTRESTO 49-51 MG PO TABS: 1.0000 | ORAL_TABLET | Freq: Two times a day (BID) | ORAL | 3 refills | Status: DC

## 2023-04-25 NOTE — Patient Instructions (Signed)
Description   Continue taking 1 tablet daily EXCEPT 1/2 tablet on Sundays, Tuesdays, and Thursdays.  Stay consistent with greens each week (2 servings per week)  Recheck INR in 3 weeks  [facility] [phone] Cardiac Clearance Fax #[phone]

## 2023-04-25 NOTE — Telephone Encounter (Signed)
Spoke with pt and spouse regarding Dr. Vanetta Shawl reply to the question about the Metoprolol. Per Dr. Bing Matter- "Keep his metoprolol at 50 mg twice daily.  "If he is taking 2426 mg of Entresto will need to increase to 4951 twice daily and Chem-7 need to be done next week"

## 2023-04-25 NOTE — Addendum Note (Signed)
Addended by: Baldo Ash D on: 04/25/2023 01:33 PM   Modules accepted: Orders

## 2023-05-10 ENCOUNTER — Telehealth: Payer: Self-pay

## 2023-05-10 NOTE — Telephone Encounter (Signed)
Spoke with Erskine Squibb, notified of results

## 2023-05-10 NOTE — Telephone Encounter (Signed)
-----   Message from Gypsy Balsam sent at 05/10/2023  9:28 AM EDT ----- Chem-7 looks good, continue present management

## 2023-05-16 ENCOUNTER — Ambulatory Visit: Payer: Medicare PPO | Attending: Cardiology

## 2023-05-16 DIAGNOSIS — Z5181 Encounter for therapeutic drug level monitoring: Secondary | ICD-10-CM

## 2023-05-16 DIAGNOSIS — Z952 Presence of prosthetic heart valve: Secondary | ICD-10-CM | POA: Diagnosis not present

## 2023-05-16 LAB — POCT INR: INR: 2.8 (ref 2.0–3.0)

## 2023-05-16 NOTE — Patient Instructions (Signed)
Description   Continue taking 1 tablet daily EXCEPT 1/2 tablet on Sundays, Tuesdays, and Thursdays.  Stay consistent with greens each week (2 servings per week)  Recheck INR in 4 weeks  [facility] [phone] Cardiac Clearance Fax #[phone]

## 2023-05-25 ENCOUNTER — Telehealth: Payer: Self-pay

## 2023-05-25 NOTE — Telephone Encounter (Signed)
[employer] notified patient does not have a [facility] referral per our [place]. Form processed to scan.

## 2023-05-31 DIAGNOSIS — M4850XA Collapsed vertebra, not elsewhere classified, site unspecified, initial encounter for fracture: Secondary | ICD-10-CM | POA: Insufficient documentation

## 2023-05-31 HISTORY — DX: Collapsed vertebra, not elsewhere classified, site unspecified, initial encounter for fracture: M48.50XA

## 2023-06-01 ENCOUNTER — Encounter: Payer: Self-pay | Admitting: Cardiology

## 2023-06-01 ENCOUNTER — Ambulatory Visit: Payer: Medicare PPO | Attending: Cardiology | Admitting: Cardiology

## 2023-06-01 VITALS — BP 126/72 | HR 75 | Ht 69.0 in | Wt 213.6 lb

## 2023-06-01 DIAGNOSIS — E113299 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 without macular edema, unspecified eye: Secondary | ICD-10-CM

## 2023-06-01 DIAGNOSIS — I342 Nonrheumatic mitral (valve) stenosis: Secondary | ICD-10-CM | POA: Diagnosis not present

## 2023-06-01 DIAGNOSIS — I4819 Other persistent atrial fibrillation: Secondary | ICD-10-CM | POA: Diagnosis not present

## 2023-06-01 DIAGNOSIS — I10 Essential (primary) hypertension: Secondary | ICD-10-CM | POA: Diagnosis not present

## 2023-06-01 DIAGNOSIS — Z7984 Long term (current) use of oral hypoglycemic drugs: Secondary | ICD-10-CM

## 2023-06-01 DIAGNOSIS — Z952 Presence of prosthetic heart valve: Secondary | ICD-10-CM

## 2023-06-01 NOTE — Progress Notes (Signed)
Cardiology Office Note:    Date:  06/01/2023   ID:  James Paul, DOB 07/03/1946, MRN [id number]  PCP:  Crisoforo Oxford, DO  Cardiologist:  Gypsy Balsam, MD    Referring MD: No ref. provider found   Chief Complaint  Patient presents with   Follow-up    History of Present Illness:    James Paul is a 77 y.o. male  very complex past medical history status post mitral and aortic valve replacement with [facility] Jude prosthesis 23 mm in aortic position 25 mm of mitral valve position that was done in 2019 in the [place] in [place]. Additional problem include anticoagulation with Coumadin, essential hypertension, hyperlipidemia, gout, peripheral vascular disease, smoker which is still ongoing, recently presented to hospital with CVA. It was felt to be related f to high fluctuation of INR. Since that time he is being followed by our clinic and has better control. Additional issue is atrial fibrillation  Comes today to months for follow-up.  He said talking about having ENT surgery but they advised he still wait at least 6 months after his CVA.  There was also some discussion about potentially doing cardioversion but in my opinion if cardioversion is in order TEE need to be done.  Denies have any chest pain tightness squeezing pressure mid chest.  He complained of being weak tired exhausted.  His wife tells me that he sleeps almost all the time  Past Medical History:  Diagnosis Date   Cataract    B/L early cataracts   CHF (congestive heart failure) (HCC)    Diabetes mellitus without complication (HCC)    GERD (gastroesophageal reflux disease)    PMH   Heart murmur    Hypercholesterolemia    Hypertension    Seasonal allergies    Sleep apnea    Wears glasses     Past Surgical History:  Procedure Laterality Date   BACK SURGERY     COLONOSCOPY     HERNIA REPAIR     PAROTIDECTOMY Left 08/08/2014   Procedure: PAROTIDECTOMY;  Surgeon: Suzanna Obey, MD;  Location: [facility] OR;  Service: ENT;   Laterality: Left;    Current Medications: Current Meds  Medication Sig   atorvastatin (LIPITOR) 40 MG tablet Take 20 mg by mouth daily.   metFORMIN (GLUCOPHAGE) 500 MG tablet Take 500 mg by mouth daily.   metoprolol tartrate (LOPRESSOR) 50 MG tablet Take 1 tablet (50 mg total) by mouth 2 (two) times daily.   sacubitril-valsartan (ENTRESTO) 49-51 MG Take 1 tablet by mouth 2 (two) times daily.   warfarin (COUMADIN) 5 MG tablet Take 5 mg by mouth as directed.   [DISCONTINUED] amLODipine (NORVASC) 10 MG tablet Take 5 mg by mouth daily.     Allergies:   Patient has no known allergies.   Social History   Socioeconomic History   Marital status: Single    Spouse name: Not on file   Number of children: Not on file   Years of education: Not on file   Highest education level: Not on file  Occupational History   Not on file  Tobacco Use   Smoking status: Every Day    Current packs/day: 0.50    Types: Cigarettes   Smokeless tobacco: Never  Substance and Sexual Activity   Alcohol use: Yes    Comment: Rare beer   Drug use: No   Sexual activity: Not on file  Other Topics Concern   Not on file  Social History Narrative  Not on file   Social Determinants of Health   Financial Resource Strain: Not on file  Food Insecurity: Low Risk  (05/17/2023)   Received from [facility]   Hunger Vital Sign    Worried About Running Out of Food in the Last Year: Never true    Ran Out of Food in the Last Year: Never true  Transportation Needs: Not on file (05/17/2023)  Physical Activity: Not on file  Stress: Not on file  Social Connections: Not on file     Family History: The patient's family history includes Aneurysm in his father; Congestive Heart Failure in his mother and sister. ROS:   Please see the history of present illness.    All 14 point review of systems negative except as described per history of present illness  EKGs/Labs/Other Studies Reviewed:         Recent  Labs: 04/11/2023: Hemoglobin 11.2; Platelets 124 05/04/2023: BUN 13; Creatinine, Ser 0.90; Potassium 4.1; Sodium 139  Recent Lipid Panel No results found for: "CHOL", "TRIG", "HDL", "CHOLHDL", "VLDL", "LDLCALC", "LDLDIRECT"  Physical Exam:    VS:  BP 126/72 (BP Location: Left Arm, Patient Position: Sitting)   Pulse 75   Ht 5\' 9"  (1.753 m)   Wt 213 lb 9.6 oz (96.9 kg)   SpO2 92%   BMI 31.54 kg/m     Wt Readings from Last 3 Encounters:  06/01/23 213 lb 9.6 oz (96.9 kg)  04/11/23 212 lb 6.4 oz (96.3 kg)  03/17/23 213 lb 12.8 oz (97 kg)     GEN:  Well nourished, well developed in no acute distress HEENT: Normal NECK: No JVD; No carotid bruits LYMPHATICS: No lymphadenopathy CARDIAC: Irregular irregular, no murmurs, no rubs, no gallops RESPIRATORY:  Clear to auscultation without rales, wheezing or rhonchi  ABDOMEN: Soft, non-tender, non-distended MUSCULOSKELETAL:  No edema; No deformity  SKIN: Warm and dry LOWER EXTREMITIES: no swelling NEUROLOGIC:  Alert and oriented x 3 PSYCHIATRIC:  Normal affect   ASSESSMENT:    1. Nonrheumatic mitral valve stenosis   2. Persistent atrial fibrillation (HCC)   3. Essential hypertension   4. 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 retinopathy without macular edema, unspecified laterality, unspecified whether long term insulin use (HCC)   5. 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  Doing well from that point review.  He is having atrial fibrillation right now.  The issues about the potential cardioversion I will schedule him to have echocardiogram done to check atrial size if atrial size is decent then we may attempt cardioversion but if cardioversion is scheduled ideally need to be transesophageal done before cardioversion. He also will require some ENT surgery we may be forced to  do a stress test before proceeding with the surgery.  However overall I would prefer him to wait 6 months after his CVA before surgery.    Medication Adjustments/Labs and Tests Ordered: Current medicines are reviewed at length with the patient today.  Concerns regarding medicines are outlined above.  Orders Placed This Encounter  Procedures   ECHOCARDIOGRAM COMPLETE   Medication changes: No orders of the defined types were placed in this encounter.   Signed, Georgeanna Lea, MD, [facility] 06/01/2023 2:55 PM    [facility]

## 2023-06-01 NOTE — Patient Instructions (Signed)
Medication Instructions:  Your physician recommends that you continue on your current medications as directed. Please refer to the Current Medication list given to you today.  *If you need a refill on your cardiac medications before your next appointment, please call your pharmacy*   Lab Work: None Ordered If you have labs (blood work) drawn today and your tests are completely normal, you will receive your results only by: [place] (if you have MyChart) OR A paper copy in the mail If you have any lab test that is abnormal or we need to change your treatment, we will call you to review the results.   Testing/Procedures: Your physician has requested that you have an echocardiogram. Echocardiography is a painless test that uses sound waves to create images of your heart. It provides your doctor with information about the size and shape of your heart and how well your heart's chambers and valves are working. This procedure takes approximately one hour. There are no restrictions for this procedure. Please do NOT wear cologne, perfume, aftershave, or lotions (deodorant is allowed). Please arrive 15 minutes prior to your appointment time.    Follow-Up: At [facility], you and your health needs are our priority.  As part of our continuing mission to provide you with exceptional heart care, we have created designated Provider Care Teams.  These Care Teams include your primary Cardiologist (physician) and Advanced Practice Providers (APPs -  Physician Assistants and Nurse Practitioners) who all work together to provide you with the care you need, when you need it.  We recommend signing up for the patient portal called "MyChart".  Sign up information is provided on this After Visit Summary.  MyChart is used to connect with patients for Virtual Visits (Telemedicine).  Patients are able to view lab/test results, encounter notes, upcoming appointments, etc.  Non-urgent messages can be sent to your  provider as well.   To learn more about what you can do with MyChart, go to [email or web address].    Your next appointment:   1 month(s)  The format for your next appointment:   In Person  Provider:   Jenne Campus, MD    Other Instructions NA

## 2023-06-13 ENCOUNTER — Telehealth: Payer: Self-pay

## 2023-06-13 ENCOUNTER — Ambulatory Visit: Payer: Medicare PPO | Attending: Cardiology

## 2023-06-13 DIAGNOSIS — Z5181 Encounter for therapeutic drug level monitoring: Secondary | ICD-10-CM | POA: Diagnosis not present

## 2023-06-13 DIAGNOSIS — Z952 Presence of prosthetic heart valve: Secondary | ICD-10-CM | POA: Diagnosis not present

## 2023-06-13 LAB — POCT INR: INR: 3.6 — AB (ref 2.0–3.0)

## 2023-06-13 NOTE — Telephone Encounter (Signed)
Pt had appt with [facility] today and stated he has been seen by Dr Jenne Pane with [facility] for upcoming procedure - right parotidectomy. Date TBD; however since pt is on Warfarin, he will need Cardiac Clearance. Pt states he has reached out out Dr Jenne Pane office but no clearance request has been sent. I called Dr Jenne Pane office and spoke with [facility], CMA. Made her aware that pt is on Warfarin and will need a Cardiac Clearance request sent to our office if Dr Jenne Pane is requesting for pt to hold Warfarin. [facility] stated she will send message to Dr Jenne Pane and fax over requested as soon as possible. Provided her with Pre-Op fax#.

## 2023-06-13 NOTE — Patient Instructions (Signed)
Description   Eat a extra serving of greens today and continue taking 1 tablet daily EXCEPT 1/2 tablet on Sundays, Tuesdays, and Thursdays.  Stay consistent with greens each week (2 servings per week)  Recheck INR in 4 weeks  Coumadin Clinic [phone] Cardiac Clearance Fax #[phone]

## 2023-06-14 NOTE — Telephone Encounter (Signed)
Patient with diagnosis of mechanical and aortic mitral valve replacement, afib on warfarin for anticoagulation.    Procedure: right parotidectomy  Date of procedure: TBD  Patient ideally will need to wait 6 months after his CVA (01/06/23) before holding warfarin. Therefore procedure should be no earlier than 07/13/23  CrCl 81 ml/min Platelet count 124  Per office protocol, patient can hold warfarin for 5 days prior to procedure.    Patient WILL need bridging with Lovenox (enoxaparin) around procedure.  **This guidance is not considered finalized until pre-operative APP has relayed final recommendations.**

## 2023-06-15 NOTE — Telephone Encounter (Signed)
Official request has not been submitted. Per pharm D notes, procedure should not be performed before 07/13/2023. Patient scheduled with Dr. Bing Matter on 07/10/2023. Updated appointment notes to reflect need for pre-op evaluation. Will remove from pool.   Etta Grandchild. Kaymen Adrian, DNP, NP-C  06/15/2023, 8:31 AM [facility] [address] Office [phone] Fax [phone]

## 2023-06-21 ENCOUNTER — Telehealth: Payer: Self-pay | Admitting: Cardiology

## 2023-06-21 NOTE — Telephone Encounter (Signed)
I will route to pharmacy team for input on holding warfarin/bridging in the event cardioversion is not pursued, then will plan to route to [facility] team to coordinate stress test under Dr. Vanetta Shawl request as well as a f/u appt after echo/stress test to finalize preoperative recommendations.

## 2023-06-21 NOTE — Telephone Encounter (Signed)
Patient with diagnosis of mechanical and aortic mitral valve replacement, afib on warfarin for anticoagulation.    Procedure: right parotidectomy  Date of procedure: TBD  Patient ideally will need to wait 6 months after his CVA (01/06/23) before holding warfarin. Therefore procedure should be no earlier than 07/13/23  CrCl 81 ml/min Platelet count 124  Per office protocol, patient can hold warfarin for 5 days prior to procedure.    Patient WILL need bridging with Lovenox (enoxaparin) around procedure.  **This guidance is not considered finalized until pre-operative APP has relayed final recommendations.**

## 2023-06-21 NOTE — Telephone Encounter (Signed)
Helping cover preop today. Clearance requested from ENT for parotid surgery. Dr. Bing Matter mentioned this in recent OV 06/01/23:  "Status post aortic valve and mitral valve replacement.  Doing well from that point review.  He is having atrial fibrillation right now.  The issues about the potential cardioversion I will schedule him to have echocardiogram done to check atrial size if atrial size is decent then we may attempt cardioversion but if cardioversion is scheduled ideally need to be transesophageal done before cardioversion. He also will require some ENT surgery we may be forced to do a stress test before proceeding with the surgery.  However overall I would prefer him to wait 6 months after his CVA before surgery."  Will need further input from Dr. Bing Matter about his final preoperative recommendations as the plan for possible cardioversion or stress testing may impact plan. He does not have follow-up scheduled again for 1 year. Please route response to P CV DIV PREOP (the pre-op pool). Thank you.

## 2023-06-21 NOTE — Telephone Encounter (Signed)
Name: James Paul  DOB: [birth year]  MRN: [id number]  Primary Cardiologist: Gypsy Balsam, MD  Chart reviewed as part of pre-operative protocol coverage. Because of [name]'s past medical history and time since last visit, he will require a follow-up in-office visit in order to better assess preoperative cardiovascular risk.  As below, patient was recently seen in office by Dr. Bing Matter who initially recommended deferring surgery for 6 months but stated that if surgery needed to be performed, consider stress testing. Patient is also pending echocardiogram to make a decision about whether cardioversion is warranted. I would suggest we schedule a formal in-office visit after the echo and stress test are complete to come back to finalize surgical clearance and cardioversion plans given this is a complex case.  Pre-op covering staff: - Dr. Bing Matter would like to schedule his patient for a Lexiscan stress test. Please route to his covering staff to help arrange this for him. - Please schedule appointment and call patient to inform them. - Please contact requesting surgeon's office via preferred method (i.e, phone, fax) to inform them of need for appointment prior to surgery.  Pharmacist reached out to relay that warfarin input had already been relayed in a previous duplicate clearance request, that patient will need Lovenox bridging off Coumadin when cleared by MD, but as below, Dr. Bing Matter is proceeding with echocardiogram and stress test first.   Laurann Montana, PA-C  06/21/2023, 1:23 PM

## 2023-06-21 NOTE — Telephone Encounter (Signed)
Pre-operative Risk Assessment    Patient Name: James Paul  DOB: [birth year] MRN: [id number]      Request for Surgical Clearance    Procedure:  Right Parotidectomy  Date of Surgery:  Clearance TBD                                 Surgeon:  Dr. Marijo Conception Group or Practice Name:  [facility] Phone number:  [phone]  Fax number:  [phone] ATTN: Albin Felling   Type of Clearance Requested:   - Medical  - Pharmacy:  Hold Warfarin (Coumadin) TBD by cardiologist   Type of Anesthesia:  Not Indicated   Additional requests/questions:  Please fax a copy of medical clearance to the surgeon's office.  Mardelle Matte   06/21/2023, 10:47 AM

## 2023-06-22 ENCOUNTER — Telehealth: Payer: Self-pay

## 2023-06-22 DIAGNOSIS — Z01818 Encounter for other preprocedural examination: Secondary | ICD-10-CM

## 2023-06-22 NOTE — Telephone Encounter (Signed)
Pt also has appt 07/10/23 with Dr. Bing Matter. I will confirm with pre op APP ok to defer clearance to MD appt. Per Edd Fabian, FNP ok to defer clearance to appt with Dr. Bing Matter 07/10/23. I will update all parties involved.

## 2023-06-22 NOTE — Telephone Encounter (Signed)
Lexi Scan ordered per Dr. Bing Matter for Pre Op

## 2023-06-29 ENCOUNTER — Telehealth: Payer: Self-pay | Admitting: Cardiology

## 2023-06-29 NOTE — Telephone Encounter (Signed)
   Pre-operative Risk Assessment    Patient Name: James Paul  DOB: [birth year] MRN: [id number]      Request for Surgical Clearance    Procedure:   Right Parotidectomy  Date of Surgery:  Clearance 08/02/23                                 Surgeon: Dr. Christia Reading Surgeon's Group or Practice Name: [facility] Phone number: [phone] Fax number: [phone]   Type of Clearance Requested:   - Medical  - Pharmacy:  Hold Warfarin (Coumadin)     Type of Anesthesia:  General    Additional requests/questions:  Please advise surgeon/provider what medications should be held.  [name]   06/29/2023, 4:54 PM

## 2023-07-04 ENCOUNTER — Telehealth: Payer: Self-pay

## 2023-07-04 NOTE — Telephone Encounter (Signed)
Detailed instructions left on the patient's answering machine. Asked to call back with any questions. S.Aby Gessel CCT

## 2023-07-05 ENCOUNTER — Ambulatory Visit: Payer: Medicare PPO | Attending: Cardiology

## 2023-07-05 ENCOUNTER — Ambulatory Visit (INDEPENDENT_AMBULATORY_CARE_PROVIDER_SITE_OTHER): Payer: Medicare PPO

## 2023-07-05 DIAGNOSIS — I342 Nonrheumatic mitral (valve) stenosis: Secondary | ICD-10-CM | POA: Diagnosis not present

## 2023-07-05 DIAGNOSIS — Z01818 Encounter for other preprocedural examination: Secondary | ICD-10-CM | POA: Diagnosis not present

## 2023-07-05 LAB — ECHOCARDIOGRAM COMPLETE
AV Mean grad: 6.8 mm[Hg]
AV Peak grad: 13 mm[Hg]
Ao pk vel: 1.8 m/s
Area-P 1/2: 2.76 cm2
Height: 69 in
S' Lateral: 4 cm
Weight: 3408 [oz_av]

## 2023-07-05 MED ORDER — REGADENOSON 0.4 MG/5ML IV SOLN
0.4000 mg | Freq: Once | INTRAVENOUS | Status: AC
Start: 2023-07-05 — End: 2023-07-05
  Administered 2023-07-05: 0.4 mg via INTRAVENOUS

## 2023-07-05 MED ORDER — TECHNETIUM TC 99M TETROFOSMIN IV KIT
11.0000 | PACK | Freq: Once | INTRAVENOUS | Status: AC | PRN
  Administered 2023-07-05: 11 via INTRAVENOUS

## 2023-07-05 MED ORDER — TECHNETIUM TC 99M TETROFOSMIN IV KIT
28.4000 | PACK | Freq: Once | INTRAVENOUS | Status: AC | PRN
  Administered 2023-07-05: 28.4 via INTRAVENOUS

## 2023-07-06 LAB — MYOCARDIAL PERFUSION IMAGING
LV dias vol: 103 mL (ref 62–150)
LV sys vol: 56 mL
Nuc Stress EF: 45 %
Peak HR: 85 {beats}/min
Rest HR: 732 {beats}/min
Rest Nuclear Isotope Dose: 11 mCi
SDS: 1
SRS: 3
SSS: 4
Stress Nuclear Isotope Dose: 28.4 mCi
TID: 0.9

## 2023-07-07 ENCOUNTER — Telehealth: Payer: Self-pay

## 2023-07-07 NOTE — Telephone Encounter (Signed)
-----   Message from [place] sent at 07/06/2023  7:51 PM EDT ----- Echocardiogram normal valve function normal heart muscle function  Myoview normal perfusion they thought the function was diminished however atrial fibrillation makes it difficult to find and I am confident that heart muscle function is normal from the echocardiogram  Overall this is a good report

## 2023-07-07 NOTE — Telephone Encounter (Signed)
Spoke with Arletha Grippe notified of results

## 2023-07-10 ENCOUNTER — Encounter: Payer: Self-pay | Admitting: Cardiology

## 2023-07-10 ENCOUNTER — Ambulatory Visit: Payer: Medicare PPO | Attending: Cardiology | Admitting: Cardiology

## 2023-07-10 VITALS — BP 128/88 | HR 82 | Ht 69.0 in | Wt 215.4 lb

## 2023-07-10 DIAGNOSIS — I342 Nonrheumatic mitral (valve) stenosis: Secondary | ICD-10-CM

## 2023-07-10 DIAGNOSIS — I10 Essential (primary) hypertension: Secondary | ICD-10-CM | POA: Diagnosis not present

## 2023-07-10 DIAGNOSIS — Z952 Presence of prosthetic heart valve: Secondary | ICD-10-CM

## 2023-07-10 DIAGNOSIS — I4819 Other persistent atrial fibrillation: Secondary | ICD-10-CM | POA: Diagnosis not present

## 2023-07-10 DIAGNOSIS — I6930 Unspecified sequelae of cerebral infarction: Secondary | ICD-10-CM

## 2023-07-10 DIAGNOSIS — K118 Other diseases of salivary glands: Secondary | ICD-10-CM

## 2023-07-10 NOTE — H&P (View-Only) (Signed)
Cardiology Office Note:    Date:  07/10/2023   ID:  James Paul, DOB 01-21-1946, MRN [id number]  PCP:  Crisoforo Oxford, DO  Cardiologist:  Gypsy Balsam, MD    Referring MD: Crisoforo Oxford, DO   Chief Complaint  Patient presents with   CLearance status     History of Present Illness:    James Paul is a 77 y.o. male   very complex past medical history status post mitral and aortic valve replacement with [facility] Jude prosthesis 23 mm in aortic position 25 mm of mitral valve position that was done in 2019 in the [place] in [place]. Additional problem include anticoagulation with Coumadin, essential hypertension, hyperlipidemia, gout, peripheral vascular disease, smoker which is still ongoing, recently presented to hospital with CVA. It was felt to be related f to high fluctuation of INR. Since that time he is being followed by our clinic and has better control. Additional issue is atrial fibrillation  He comes today to my office to talk about incoming ENT surgery.  He is being scheduled already on sixth of next month.  Overall he is doing well.  He said he does a lot of however his wife was with him in the room rolling her eyes when he said that.  He spent majority of time sitting very sedentary.  Denies have any chest pain tightness squeezing pressure burning chest.  Past Medical History:  Diagnosis Date   Cataract    B/L early cataracts   CHF (congestive heart failure) (HCC)    Diabetes mellitus without complication (HCC)    GERD (gastroesophageal reflux disease)    PMH   Heart murmur    Hypercholesterolemia    Hypertension    Seasonal allergies    Sleep apnea    Wears glasses     Past Surgical History:  Procedure Laterality Date   BACK SURGERY     COLONOSCOPY     HERNIA REPAIR     PAROTIDECTOMY Left 08/08/2014   Procedure: PAROTIDECTOMY;  Surgeon: Suzanna Obey, MD;  Location: [facility] OR;  Service: ENT;  Laterality: Left;    Current Medications: Current Meds   Medication Sig   atorvastatin (LIPITOR) 40 MG tablet Take 40 mg by mouth daily.   Cyanocobalamin (B-12) 2500 MCG TABS Take 2,500 mcg by mouth daily.   latanoprost (XALATAN) 0.005 % ophthalmic solution Place 1 drop into both eyes at bedtime.   metoprolol tartrate (LOPRESSOR) 50 MG tablet Take 1 tablet (50 mg total) by mouth 2 (two) times daily.   pantoprazole (PROTONIX) 40 MG tablet Take 40 mg by mouth daily.   sacubitril-valsartan (ENTRESTO) 49-51 MG Take 1 tablet by mouth 2 (two) times daily.   sitaGLIPtin (JANUVIA) 25 MG tablet Take 25 mg by mouth daily.   tiZANidine (ZANAFLEX) 4 MG tablet Take 4 mg by mouth at bedtime.   warfarin (COUMADIN) 5 MG tablet Take 2.5-5 mg by mouth See admin instructions. 2.5 mg Sun, Tues, and Thurs and 5 mg all other days   [DISCONTINUED] metFORMIN (GLUCOPHAGE) 500 MG tablet Take 500 mg by mouth daily.     Allergies:   Patient has no known allergies.   Social History   Socioeconomic History   Marital status: Single    Spouse name: Not on file   Number of children: Not on file   Years of education: Not on file   Highest education level: Not on file  Occupational History   Not on file  Tobacco Use  Smoking status: Every Day    Current packs/day: 0.50    Types: Cigarettes   Smokeless tobacco: Never  Substance and Sexual Activity   Alcohol use: Yes    Comment: Rare beer   Drug use: No   Sexual activity: Not on file  Other Topics Concern   Not on file  Social History Narrative   Not on file   Social Determinants of Health   Financial Resource Strain: Not on file  Food Insecurity: Low Risk  (05/17/2023)   Received from [facility]   Hunger Vital Sign    Worried About Running Out of Food in the Last Year: Never true    Ran Out of Food in the Last Year: Never true  Transportation Needs: Not on file (05/17/2023)  Physical Activity: Not on file  Stress: Not on file  Social Connections: Not on file     Family History: The patient's family  history includes Aneurysm in his father; Congestive Heart Failure in his mother and sister. ROS:   Please see the history of present illness.    All 14 point review of systems negative except as described per history of present illness  EKGs/Labs/Other Studies Reviewed:         Recent Labs: 04/11/2023: Hemoglobin 11.2; Platelets 124 05/04/2023: BUN 13; Creatinine, Ser 0.90; Potassium 4.1; Sodium 139  Recent Lipid Panel No results found for: "CHOL", "TRIG", "HDL", "CHOLHDL", "VLDL", "LDLCALC", "LDLDIRECT"  Physical Exam:    VS:  BP 128/88 (BP Location: Left Arm, Patient Position: Sitting)   Pulse 82   Ht 5\' 9"  (1.753 m)   Wt 215 lb 6.4 oz (97.7 kg)   SpO2 94%   BMI 31.81 kg/m     Wt Readings from Last 3 Encounters:  07/10/23 215 lb 6.4 oz (97.7 kg)  07/05/23 213 lb (96.6 kg)  06/01/23 213 lb 9.6 oz (96.9 kg)     GEN:  Well nourished, well developed in no acute distress HEENT: Normal NECK: No JVD; No carotid bruits LYMPHATICS: No lymphadenopathy CARDIAC: Irregularly irregular, systolic murmur grade 2/6 basilar border sternum, no rubs, no gallops RESPIRATORY:  Clear to auscultation without rales, wheezing or rhonchi  ABDOMEN: Soft, non-tender, non-distended MUSCULOSKELETAL:  No edema; No deformity  SKIN: Warm and dry LOWER EXTREMITIES: no swelling NEUROLOGIC:  Alert and oriented x 3 PSYCHIATRIC:  Normal affect   ASSESSMENT:    1. Essential hypertension   2. Persistent atrial fibrillation (HCC)   3. Nonrheumatic mitral valve stenosis   4. Mass of parotid gland   5. Late effect of cerebrovascular accident (CVA)   6. 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  Evaluation included echocardiogram which showed low normal ejection fraction normally functioning prosthetic valves, he also had a stress test done which showed no evidence of  ischemia.  Ongoing issues atrial fibrillation he tolerated atrial fibrillation quite well and he is anticoagulated.  We spent a great deal of time talking today about upcoming surgery from heart point of view he is ready to proceed 6 months after his CVA. Atrial fibrillation which appears to be persistent.  Size of the left atrium is only mildly enlarged.  Therefore I think have some chances of getting him back to normal rhythm.  However, with a history of stroke I would prefer him to have TEE done before proceeding with a cardioversion.  Will schedule him to have TEE done at [facility] with cardioversion if TEE is negative.  Procedure explained to him including always benefits as alternative. Late effect of CVA.  Stable. Essential hypertension: Blood pressure well-controlled continue present management. Cardiomyopathy with low normal ejection fraction.  On Entresto which I will continue.  Tolerating well   Medication Adjustments/Labs and Tests Ordered: Current medicines are reviewed at length with the patient today.  Concerns regarding medicines are outlined above.  No orders of the defined types were placed in this encounter.  Medication changes: No orders of the defined types were placed in this encounter.   Signed, Georgeanna Lea, MD, [facility] 07/10/2023 2:11 PM    [facility]

## 2023-07-10 NOTE — Patient Instructions (Addendum)
Medication Instructions:  Your physician recommends that you continue on your current medications as directed. Please refer to the Current Medication list given to you today.  *If you need a refill on your cardiac medications before your next appointment, please call your pharmacy*   Lab Work: None Ordered If you have labs (blood work) drawn today and your tests are completely normal, you will receive your results only by: MyChart Message (if you have MyChart) OR A paper copy in the mail If you have any lab test that is abnormal or we need to change your treatment, we will call you to review the results.   Testing/Procedures:    Dear James Paul  You are scheduled for a TEE (Transesophageal Echocardiogram) on Friday, October 18 with Dr. Flora Lipps.  Please arrive at the [facility] (Main Entrance A) at [facility]: [address] [place], [place] [address] at 9:00 AM (This time is 1.5 hour(s) before your procedure to ensure your preparation). Free valet parking service is available. You will check in at ADMITTING. The support person will be asked to wait in the waiting room.  It is OK to have someone drop you off and come back when you are ready to be discharged.     DIET:  Nothing to eat or drink after midnight except a sip of water with medications (see medication instructions below)  Continue taking your anticoagulant (blood thinner): Warfarin (Coumadin).  You will need to continue this after your procedure until you are told by your provider that it is safe to stop.    LABS: 07-11-23 at [facility]  HOLD: All Diabetic Medications morning of procedure  HOLD: All Fluid Pills morning of procedure     FYI:  For your safety, and to allow [place] to monitor your vital signs accurately during the surgery/procedure we request: If you have artificial nails, gel coating, SNS etc, please have those removed prior to your surgery/procedure. Not having the nail  coverings /polish removed may result in cancellation or delay of your surgery/procedure.  You must have a responsible person to drive you home and stay in the waiting area during your procedure. Failure to do so could result in cancellation.  Bring your insurance cards.  *Special Note: Every effort is made to have your procedure done on time. Occasionally there are emergencies that occur at the hospital that may cause delays. Please be patient if a delay does occur.       Follow-Up: At [facility], you and your health needs are our priority.  As part of our continuing mission to provide you with exceptional heart care, we have created designated Provider Care Teams.  These Care Teams include your primary Cardiologist (physician) and Advanced Practice Providers (APPs -  Physician Assistants and Nurse Practitioners) who all work together to provide you with the care you need, when you need it.  We recommend signing up for the patient portal called "MyChart".  Sign up information is provided on this After Visit Summary.  MyChart is used to connect with patients for Virtual Visits (Telemedicine).  Patients are able to view lab/test results, encounter notes, upcoming appointments, etc.  Non-urgent messages can be sent to your provider as well.   To learn more about what you can do with MyChart, go to [email or web address].    Your next appointment:   3 month(s)  The format for your next appointment:   In Person  Provider:   Gypsy Balsam, MD  Other Instructions NA

## 2023-07-10 NOTE — Progress Notes (Unsigned)
Cardiology Office Note:    Date:  07/10/2023   ID:  James Paul, DOB 01-21-1946, MRN [id number]  PCP:  Crisoforo Oxford, DO  Cardiologist:  Gypsy Balsam, MD    Referring MD: Crisoforo Oxford, DO   Chief Complaint  Patient presents with   CLearance status     History of Present Illness:    James Paul is a 77 y.o. male   very complex past medical history status post mitral and aortic valve replacement with [facility] Jude prosthesis 23 mm in aortic position 25 mm of mitral valve position that was done in 2019 in the [place] in [place]. Additional problem include anticoagulation with Coumadin, essential hypertension, hyperlipidemia, gout, peripheral vascular disease, smoker which is still ongoing, recently presented to hospital with CVA. It was felt to be related f to high fluctuation of INR. Since that time he is being followed by our clinic and has better control. Additional issue is atrial fibrillation  He comes today to my office to talk about incoming ENT surgery.  He is being scheduled already on sixth of next month.  Overall he is doing well.  He said he does a lot of however his wife was with him in the room rolling her eyes when he said that.  He spent majority of time sitting very sedentary.  Denies have any chest pain tightness squeezing pressure burning chest.  Past Medical History:  Diagnosis Date   Cataract    B/L early cataracts   CHF (congestive heart failure) (HCC)    Diabetes mellitus without complication (HCC)    GERD (gastroesophageal reflux disease)    PMH   Heart murmur    Hypercholesterolemia    Hypertension    Seasonal allergies    Sleep apnea    Wears glasses     Past Surgical History:  Procedure Laterality Date   BACK SURGERY     COLONOSCOPY     HERNIA REPAIR     PAROTIDECTOMY Left 08/08/2014   Procedure: PAROTIDECTOMY;  Surgeon: Suzanna Obey, MD;  Location: [facility] OR;  Service: ENT;  Laterality: Left;    Current Medications: Current Meds   Medication Sig   atorvastatin (LIPITOR) 40 MG tablet Take 40 mg by mouth daily.   Cyanocobalamin (B-12) 2500 MCG TABS Take 2,500 mcg by mouth daily.   latanoprost (XALATAN) 0.005 % ophthalmic solution Place 1 drop into both eyes at bedtime.   metoprolol tartrate (LOPRESSOR) 50 MG tablet Take 1 tablet (50 mg total) by mouth 2 (two) times daily.   pantoprazole (PROTONIX) 40 MG tablet Take 40 mg by mouth daily.   sacubitril-valsartan (ENTRESTO) 49-51 MG Take 1 tablet by mouth 2 (two) times daily.   sitaGLIPtin (JANUVIA) 25 MG tablet Take 25 mg by mouth daily.   tiZANidine (ZANAFLEX) 4 MG tablet Take 4 mg by mouth at bedtime.   warfarin (COUMADIN) 5 MG tablet Take 2.5-5 mg by mouth See admin instructions. 2.5 mg Sun, Tues, and Thurs and 5 mg all other days   [DISCONTINUED] metFORMIN (GLUCOPHAGE) 500 MG tablet Take 500 mg by mouth daily.     Allergies:   Patient has no known allergies.   Social History   Socioeconomic History   Marital status: Single    Spouse name: Not on file   Number of children: Not on file   Years of education: Not on file   Highest education level: Not on file  Occupational History   Not on file  Tobacco Use  Smoking status: Every Day    Current packs/day: 0.50    Types: Cigarettes   Smokeless tobacco: Never  Substance and Sexual Activity   Alcohol use: Yes    Comment: Rare beer   Drug use: No   Sexual activity: Not on file  Other Topics Concern   Not on file  Social History Narrative   Not on file   Social Determinants of Health   Financial Resource Strain: Not on file  Food Insecurity: Low Risk  (05/17/2023)   Received from [facility]   Hunger Vital Sign    Worried About Running Out of Food in the Last Year: Never true    Ran Out of Food in the Last Year: Never true  Transportation Needs: Not on file (05/17/2023)  Physical Activity: Not on file  Stress: Not on file  Social Connections: Not on file     Family History: The patient's family  history includes Aneurysm in his father; Congestive Heart Failure in his mother and sister. ROS:   Please see the history of present illness.    All 14 point review of systems negative except as described per history of present illness  EKGs/Labs/Other Studies Reviewed:         Recent Labs: 04/11/2023: Hemoglobin 11.2; Platelets 124 05/04/2023: BUN 13; Creatinine, Ser 0.90; Potassium 4.1; Sodium 139  Recent Lipid Panel No results found for: "CHOL", "TRIG", "HDL", "CHOLHDL", "VLDL", "LDLCALC", "LDLDIRECT"  Physical Exam:    VS:  BP 128/88 (BP Location: Left Arm, Patient Position: Sitting)   Pulse 82   Ht 5\' 9"  (1.753 m)   Wt 215 lb 6.4 oz (97.7 kg)   SpO2 94%   BMI 31.81 kg/m     Wt Readings from Last 3 Encounters:  07/10/23 215 lb 6.4 oz (97.7 kg)  07/05/23 213 lb (96.6 kg)  06/01/23 213 lb 9.6 oz (96.9 kg)     GEN:  Well nourished, well developed in no acute distress HEENT: Normal NECK: No JVD; No carotid bruits LYMPHATICS: No lymphadenopathy CARDIAC: Irregularly irregular, systolic murmur grade 2/6 basilar border sternum, no rubs, no gallops RESPIRATORY:  Clear to auscultation without rales, wheezing or rhonchi  ABDOMEN: Soft, non-tender, non-distended MUSCULOSKELETAL:  No edema; No deformity  SKIN: Warm and dry LOWER EXTREMITIES: no swelling NEUROLOGIC:  Alert and oriented x 3 PSYCHIATRIC:  Normal affect   ASSESSMENT:    1. Essential hypertension   2. Persistent atrial fibrillation (HCC)   3. Nonrheumatic mitral valve stenosis   4. Mass of parotid gland   5. Late effect of cerebrovascular accident (CVA)   6. 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  Evaluation included echocardiogram which showed low normal ejection fraction normally functioning prosthetic valves, he also had a stress test done which showed no evidence of  ischemia.  Ongoing issues atrial fibrillation he tolerated atrial fibrillation quite well and he is anticoagulated.  We spent a great deal of time talking today about upcoming surgery from heart point of view he is ready to proceed 6 months after his CVA. Atrial fibrillation which appears to be persistent.  Size of the left atrium is only mildly enlarged.  Therefore I think have some chances of getting him back to normal rhythm.  However, with a history of stroke I would prefer him to have TEE done before proceeding with a cardioversion.  Will schedule him to have TEE done at [facility] with cardioversion if TEE is negative.  Procedure explained to him including always benefits as alternative. Late effect of CVA.  Stable. Essential hypertension: Blood pressure well-controlled continue present management. Cardiomyopathy with low normal ejection fraction.  On Entresto which I will continue.  Tolerating well   Medication Adjustments/Labs and Tests Ordered: Current medicines are reviewed at length with the patient today.  Concerns regarding medicines are outlined above.  No orders of the defined types were placed in this encounter.  Medication changes: No orders of the defined types were placed in this encounter.   Signed, Georgeanna Lea, MD, [facility] 07/10/2023 2:11 PM    [facility]

## 2023-07-10 NOTE — Pre-Procedure Instructions (Signed)
Surgical Instructions   Your procedure is scheduled on August 02, 2023. Report to [facility] Main Entrance "A" at 6:30 A.M., then check in with the Admitting office. Any questions or running late day of surgery: call [phone]  Questions prior to your surgery date: call [phone], Monday-Friday, 8am-4pm. If you experience any cold or flu symptoms such as cough, fever, chills, shortness of breath, etc. between now and your scheduled surgery, please notify us at the above number.     Remember:  Do not eat after midnight the night before your surgery   You may drink clear liquids until 5:30 AM the morning of your surgery.   Clear liquids allowed are: Water, Non-Citrus Juices (without pulp), Carbonated Beverages, Clear Tea, Black Coffee Only (NO MILK, CREAM OR POWDERED CREAMER of any kind), and Gatorade.    Take these medicines the morning of surgery with A SIP OF WATER: atorvastatin (LIPITOR)  metoprolol tartrate (LOPRESSOR)  pantoprazole (PROTONIX)    Follow your surgeon's instructions on when to stop warfarin (COUMADIN).  If no instructions were given by your surgeon then you will need to call the office to get those instructions.     One week prior to surgery, STOP taking any Aspirin (unless otherwise instructed by your surgeon) Aleve, Naproxen, Ibuprofen, Motrin, Advil, Goody's, BC's, all herbal medications, fish oil, and non-prescription vitamins.   WHAT DO I DO ABOUT MY DIABETES MEDICATION?   Do not takesitaGLIPtin (JANUVIA) the morning of surgery.   HOW TO MANAGE YOUR DIABETES BEFORE AND AFTER SURGERY  Why is it important to control my blood sugar before and after surgery? Improving blood sugar levels before and after surgery helps healing and can limit problems. A way of improving blood sugar control is eating a healthy diet by:  Eating less sugar and carbohydrates  Increasing activity/exercise  Talking with your doctor about reaching your blood sugar  goals High blood sugars (greater than 180 mg/dL) can raise your risk of infections and slow your recovery, so you will need to focus on controlling your diabetes during the weeks before surgery. Make sure that the doctor who takes care of your diabetes knows about your planned surgery including the date and location.  How do I manage my blood sugar before surgery? Check your blood sugar at least 4 times a day, starting 2 days before surgery, to make sure that the level is not too high or low.  Check your blood sugar the morning of your surgery when you wake up and every 2 hours until you get to the Short Stay unit.  If your blood sugar is less than 70 mg/dL, you will need to treat for low blood sugar: Do not take insulin. Treat a low blood sugar (less than 70 mg/dL) with  cup of clear juice (cranberry or apple), 4 glucose tablets, OR glucose gel. Recheck blood sugar in 15 minutes after treatment (to make sure it is greater than 70 mg/dL). If your blood sugar is not greater than 70 mg/dL on recheck, call [phone] for further instructions. Report your blood sugar to the short stay nurse when you get to Short Stay.  If you are admitted to the hospital after surgery: Your blood sugar will be checked by the staff and you will probably be given insulin after surgery (instead of oral diabetes medicines) to make sure you have good blood sugar levels. The goal for blood sugar control after surgery is 80-180 mg/dL.  Do NOT Smoke (Tobacco/Vaping) for 24 hours prior to your procedure.  If you use a CPAP at night, you may bring your mask/headgear for your overnight stay.   You will be asked to remove any contacts, glasses, piercing's, hearing aid's, dentures/partials prior to surgery. Please bring cases for these items if needed.    Patients discharged the day of surgery will not be allowed to drive home, and someone needs to stay with them for 24 hours.  SURGICAL WAITING  ROOM VISITATION Patients may have no more than 2 support people in the waiting area - these visitors may rotate.   Pre-op nurse will coordinate an appropriate time for 1 ADULT support person, who may not rotate, to accompany patient in pre-op.  Children under the age of 63 must have an adult with them who is not the patient and must remain in the main waiting area with an adult.  If the patient needs to stay at the hospital during part of their recovery, the visitor guidelines for inpatient rooms apply.  Please refer to the [facility] website for the visitor guidelines for any additional information.   If you received a COVID test during your pre-op visit  it is requested that you wear a mask when out in public, stay away from anyone that may not be feeling well and notify your surgeon if you develop symptoms. If you have been in contact with anyone that has tested positive in the last 10 days please notify you surgeon.      Pre-operative CHG Bathing Instructions   You can play a key role in reducing the risk of infection after surgery. Your skin needs to be as free of germs as possible. You can reduce the number of germs on your skin by washing with CHG (chlorhexidine gluconate) soap before surgery. CHG is an antiseptic soap that kills germs and continues to kill germs even after washing.   DO NOT use if you have an allergy to chlorhexidine/CHG or antibacterial soaps. If your skin becomes reddened or irritated, stop using the CHG and notify one of our RNs at [phone].              TAKE A SHOWER THE NIGHT BEFORE SURGERY AND THE DAY OF SURGERY    Please keep in mind the following:  DO NOT shave, including legs and underarms, 48 hours prior to surgery.   You may shave your face before/day of surgery.  Place clean sheets on your bed the night before surgery Use a clean washcloth (not used since being washed) for each shower. DO NOT sleep with pet's night before surgery.  CHG Shower  Instructions:  Wash your face and private area with normal soap. If you choose to wash your hair, wash first with your normal shampoo.  After you use shampoo/soap, rinse your hair and body thoroughly to remove shampoo/soap residue.  Turn the water OFF and apply half the bottle of CHG soap to a CLEAN washcloth.  Apply CHG soap ONLY FROM YOUR NECK DOWN TO YOUR TOES (washing for 3-5 minutes)  DO NOT use CHG soap on face, private areas, open wounds, or sores.  Pay special attention to the area where your surgery is being performed.  If you are having back surgery, having someone wash your back for you may be helpful. Wait 2 minutes after CHG soap is applied, then you may rinse off the CHG soap.  Pat dry with a clean towel  Put on clean pajamas  Additional instructions for the day of surgery: DO NOT APPLY any lotions, deodorants, cologne, or perfumes.   Do not wear jewelry or makeup Do not wear nail polish, gel polish, artificial nails, or any other type of covering on natural nails (fingers and toes) Do not bring valuables to the hospital. [facility] is not responsible for valuables/personal belongings. Put on clean/comfortable clothes.  Please brush your teeth.  Ask your nurse before applying any prescription medications to the skin.

## 2023-07-11 ENCOUNTER — Other Ambulatory Visit: Payer: Self-pay

## 2023-07-11 ENCOUNTER — Encounter (HOSPITAL_COMMUNITY)
Admission: RE | Admit: 2023-07-11 | Discharge: 2023-07-11 | Disposition: A | Discharge status: Home / Self Care | Payer: Medicare PPO | Source: Ambulatory Visit | Attending: Otolaryngology | Admitting: Otolaryngology

## 2023-07-11 ENCOUNTER — Ambulatory Visit: Payer: Medicare PPO | Attending: Cardiology

## 2023-07-11 ENCOUNTER — Encounter (HOSPITAL_COMMUNITY): Payer: Self-pay

## 2023-07-11 VITALS — BP 146/93 | HR 69 | Temp 98.3°F | Resp 18 | Ht 69.0 in | Wt 216.3 lb

## 2023-07-11 DIAGNOSIS — Z952 Presence of prosthetic heart valve: Secondary | ICD-10-CM

## 2023-07-11 DIAGNOSIS — E119 Type 2 diabetes mellitus without complications: Secondary | ICD-10-CM | POA: Insufficient documentation

## 2023-07-11 DIAGNOSIS — Z5181 Encounter for therapeutic drug level monitoring: Secondary | ICD-10-CM

## 2023-07-11 DIAGNOSIS — Z7901 Long term (current) use of anticoagulants: Secondary | ICD-10-CM

## 2023-07-11 DIAGNOSIS — Z01812 Encounter for preprocedural laboratory examination: Secondary | ICD-10-CM | POA: Diagnosis present

## 2023-07-11 DIAGNOSIS — I4819 Other persistent atrial fibrillation: Secondary | ICD-10-CM

## 2023-07-11 HISTORY — DX: Cardiac arrhythmia, unspecified: I49.9

## 2023-07-11 HISTORY — DX: Unspecified osteoarthritis, unspecified site: M19.90

## 2023-07-11 LAB — CBC
HCT: 35.6 % — ABNORMAL LOW (ref 39.0–52.0)
Hemoglobin: 11.7 g/dL — ABNORMAL LOW (ref 13.0–17.0)
MCH: 30.5 pg (ref 26.0–34.0)
MCHC: 32.9 g/dL (ref 30.0–36.0)
MCV: 92.7 fL (ref 80.0–100.0)
Platelets: 145 10*3/uL — ABNORMAL LOW (ref 150–400)
RBC: 3.84 MIL/uL — ABNORMAL LOW (ref 4.22–5.81)
RDW: 16.7 % — ABNORMAL HIGH (ref 11.5–15.5)
WBC: 6.3 10*3/uL (ref 4.0–10.5)
nRBC: 0 % (ref 0.0–0.2)

## 2023-07-11 LAB — BASIC METABOLIC PANEL
Anion gap: 9 (ref 5–15)
BUN: 11 mg/dL (ref 8–23)
CO2: 25 mmol/L (ref 22–32)
Calcium: 8.7 mg/dL — ABNORMAL LOW (ref 8.9–10.3)
Chloride: 104 mmol/L (ref 98–111)
Creatinine, Ser: 0.9 mg/dL (ref 0.61–1.24)
GFR, Estimated: >60 mL/min (ref >60)
Glucose, Bld: 185 mg/dL — ABNORMAL HIGH (ref 70–99)
Potassium: 2.9 mmol/L — ABNORMAL LOW (ref 3.5–5.1)
Sodium: 138 mmol/L (ref 135–145)

## 2023-07-11 LAB — GLUCOSE, CAPILLARY: Glucose-Capillary: 216 mg/dL — ABNORMAL HIGH (ref 70–99)

## 2023-07-11 LAB — POCT INR: INR: 2.7 (ref 2.0–3.0)

## 2023-07-11 LAB — HEMOGLOBIN A1C
Hgb A1c MFr Bld: 5.6 % (ref 4.8–5.6)
Mean Plasma Glucose: 114.02 mg/dL

## 2023-07-11 NOTE — Patient Instructions (Signed)
Description   Take 1 tablet today and then continue taking 1 tablet daily EXCEPT 1/2 tablet on Sundays, Tuesdays, and Thursdays.  Stay consistent with greens each week (2 servings per week)  Recheck INR in 1 week post DCCV  Coumadin Clinic [phone] Cardiac Clearance Fax #[phone]

## 2023-07-11 NOTE — Progress Notes (Signed)
PCP - Vernelle Emerald, DO Cardiologist - Dr. Gypsy Balsam - Last office visit 06/01/2023   PPM/ICD - Denies Device Orders - n/a Rep Notified - n/a  Chest x-ray - n/a EKG - 04/24/2023 Stress Test - 07/05/2023 ECHO - 07/05/2023 Cardiac Cath - Denies  Sleep Study - +OSA. Pt wears CPAP nightly with 0.3L bled into machine. He does not know the pressure settings.  Pt is DM2. He has a glucose meter at home, but stopped checking his blood sugars months ago. When he was checking it regularly, normal fasting range was around 125. CBG at pre-op 216. Pt had rice krispies cereal and a bear claw doughnut thus far today. A1c result pending. Pt also instructed to start checking his blood sugars regularly to decrease any chance of a high sugar cancelling his surgery. Pt and wife understood instructions  Last dose of GLP1 agonist- n/a GLP1 instructions: n/a  Blood Thinner Instructions: Pt has appointment with Coumadin clinic next week to receive instructions for holding Coumadin. Aspirin Instructions: n/a  ERAS Protcol - Clear liquids until 0530 morning of surgery PRE-SURGERY Ensure or G2- n/a  COVID TEST- n/a   Anesthesia review: Yes. Cardiac Clearance. 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 He has right arm weakness that is slowly improving. He has hx of paroxsymal A.Fib that was corrected with cardioversion in the past, but went back into A.Fib after his recent CVA. He is scheduled for a cardioversion 07/14/23 with TEE.     Patient denies shortness of breath, fever, cough and chest pain at PAT appointment. Pt denies any respiratory illness/infection in the last two months.    All instructions explained to the patient, with a verbal understanding of the material. Patient agrees to go over the instructions while at home for a better understanding. Patient also instructed to self quarantine after being tested for COVID-19.  The opportunity to ask questions was provided.

## 2023-07-12 ENCOUNTER — Telehealth: Payer: Self-pay | Admitting: *Deleted

## 2023-07-12 DIAGNOSIS — E876 Hypokalemia: Secondary | ICD-10-CM

## 2023-07-12 LAB — CBC
Hematocrit: 35.1 % — ABNORMAL LOW (ref 37.5–51.0)
Hemoglobin: 11.4 g/dL — ABNORMAL LOW (ref 13.0–17.7)
MCH: 30.2 pg (ref 26.6–33.0)
MCHC: 32.5 g/dL (ref 31.5–35.7)
MCV: 93 fL (ref 79–97)
Platelets: 81 10*3/uL — CL (ref 150–450)
RBC: 3.78 x10E6/uL — ABNORMAL LOW (ref 4.14–5.80)
RDW: 15.6 % — ABNORMAL HIGH (ref 11.6–15.4)
WBC: 5.4 10*3/uL (ref 3.4–10.8)

## 2023-07-12 LAB — BASIC METABOLIC PANEL
BUN/Creatinine Ratio: 13 (ref 10–24)
BUN: 12 mg/dL (ref 8–27)
CO2: 23 mmol/L (ref 20–29)
Calcium: 8.6 mg/dL (ref 8.6–10.2)
Chloride: 101 mmol/L (ref 96–106)
Creatinine, Ser: 0.89 mg/dL (ref 0.76–1.27)
Glucose: 186 mg/dL — ABNORMAL HIGH (ref 70–99)
Potassium: 3.1 mmol/L — ABNORMAL LOW (ref 3.5–5.2)
Sodium: 141 mmol/L (ref 134–144)
eGFR: 89 mL/min/{1.73_m2} (ref >59)

## 2023-07-12 MED ORDER — POTASSIUM CHLORIDE ER 10 MEQ PO TBCR: 10.0000 meq | EXTENDED_RELEASE_TABLET | ORAL | 3 refills | Status: DC

## 2023-07-12 NOTE — Telephone Encounter (Signed)
   Patient Name: James Paul  DOB: 1946-09-12 MRN: [id number]  Primary Cardiologist: Gypsy Balsam, MD  Chart reviewed as part of pre-operative protocol coverage. Pre-op clearance already addressed by colleagues in earlier phone notes. To summarize recommendations:  -He should be fine to proceed with surgery as scheduled regardless if we get him back to normal rhythm  -Dr. Bing Matter   Pt is pending cardioversion on 07/14/23, typically we do not interrupt anticoagulation for 4 weeks after and would recommend that procedure date be moved to 5 weeks after cardioversion date, as after the 4 week mark he would then require a 5 day warfarin hold with Lovenox bridge. This can be coordinated at [facility] Coumadin clinic where pt is followed.   Will route this bundled recommendation to requesting provider via Epic fax function and remove from pre-op pool. Please call with questions.  Sharlene Dory, PA-C 07/12/2023, 3:50 PM

## 2023-07-12 NOTE — Telephone Encounter (Signed)
Patient with diagnosis of mechanical mitral and aortic valve replacements, afib, and stroke on warfarin for anticoagulation.    Procedure: right parotidectomy Date of procedure: 08/02/23  CrCl 15mL/min using adjusted body weight Platelet count 145K  Pt is pending cardioversion on 07/14/23, typically we do not interrupt anticoagulation for 4 weeks after and would recommend that procedure date be moved to 5 weeks after cardioversion date, as after the 4 week mark he would then require a 5 day warfarin hold with Lovenox bridge. This can be coordinated at [facility] Coumadin clinic where pt is followed.   **This guidance is not considered finalized until pre-operative APP has relayed final recommendations.**

## 2023-07-12 NOTE — Telephone Encounter (Signed)
Spoke with pt and let him know his potassium was low (2.9) on last lab drawn and he needs to take potassium for upcoming procedure. Instructed him to take 4 tablets today (10/16), (40 meq) and then 1 tablet (10 meq) a day starting tomorrow (10/17). Pt is to have his potassium checked again next week. Will get it done on Tuesday (10/22) when he comes for Coumadin Check. Put order in for this to be done. Pt verbalized understanding and had no further questions.

## 2023-07-12 NOTE — Progress Notes (Signed)
Dr. Mackey Birchwood and Dr. Gypsy Balsam made aware of 2.9 Potassium on pre-surgical labs. Dr. Vanetta Shawl office will order potassium replacement prior to procedure Friday, 07/14/23. BMP ordered for Friday, 07/14/23 per Dr. Flora Lipps.

## 2023-07-13 NOTE — Progress Notes (Signed)
Unable to reach patient about procedure, but was able to leave a detailed message. Stated that the patient needed to arrive at the hospital at 0800 , remain NPO after 0000, needs to have a ride home and a responsible adult to stay with them for 24 hours after the procedure. Instructed the patient to call back if they had any questions.

## 2023-07-14 ENCOUNTER — Encounter (HOSPITAL_COMMUNITY): Payer: Self-pay | Admitting: Cardiovascular Disease

## 2023-07-14 ENCOUNTER — Other Ambulatory Visit: Payer: Self-pay

## 2023-07-14 ENCOUNTER — Encounter (HOSPITAL_COMMUNITY)
Admission: RE | Disposition: A | Discharge status: Home / Self Care | Payer: Self-pay | Source: Home / Self Care | Attending: Cardiovascular Disease

## 2023-07-14 ENCOUNTER — Ambulatory Visit (HOSPITAL_COMMUNITY): Payer: Medicare PPO | Admitting: Certified Registered Nurse Anesthetist

## 2023-07-14 ENCOUNTER — Ambulatory Visit (HOSPITAL_COMMUNITY): Payer: Medicare PPO | Admitting: Physician Assistant

## 2023-07-14 ENCOUNTER — Ambulatory Visit (HOSPITAL_COMMUNITY)
Admission: RE | Admit: 2023-07-14 | Discharge: 2023-07-14 | Disposition: A | Discharge status: Home / Self Care | Payer: Medicare PPO | Attending: Cardiovascular Disease | Admitting: Cardiovascular Disease

## 2023-07-14 ENCOUNTER — Ambulatory Visit (HOSPITAL_BASED_OUTPATIENT_CLINIC_OR_DEPARTMENT_OTHER)
Admission: RE | Admit: 2023-07-14 | Discharge: 2023-07-14 | Disposition: A | Discharge status: Home / Self Care | Payer: Medicare PPO | Source: Ambulatory Visit | Attending: Cardiovascular Disease | Admitting: Cardiovascular Disease

## 2023-07-14 DIAGNOSIS — Z7901 Long term (current) use of anticoagulants: Secondary | ICD-10-CM | POA: Diagnosis not present

## 2023-07-14 DIAGNOSIS — Z952 Presence of prosthetic heart valve: Secondary | ICD-10-CM | POA: Insufficient documentation

## 2023-07-14 DIAGNOSIS — E1151 Type 2 diabetes mellitus with diabetic peripheral angiopathy without gangrene: Secondary | ICD-10-CM | POA: Insufficient documentation

## 2023-07-14 DIAGNOSIS — I110 Hypertensive heart disease with heart failure: Secondary | ICD-10-CM | POA: Diagnosis not present

## 2023-07-14 DIAGNOSIS — K219 Gastro-esophageal reflux disease without esophagitis: Secondary | ICD-10-CM | POA: Diagnosis not present

## 2023-07-14 DIAGNOSIS — K118 Other diseases of salivary glands: Secondary | ICD-10-CM | POA: Insufficient documentation

## 2023-07-14 DIAGNOSIS — Z6831 Body mass index (BMI) 31.0-31.9, adult: Secondary | ICD-10-CM | POA: Insufficient documentation

## 2023-07-14 DIAGNOSIS — I509 Heart failure, unspecified: Secondary | ICD-10-CM | POA: Diagnosis not present

## 2023-07-14 DIAGNOSIS — E785 Hyperlipidemia, unspecified: Secondary | ICD-10-CM | POA: Diagnosis not present

## 2023-07-14 DIAGNOSIS — Z7984 Long term (current) use of oral hypoglycemic drugs: Secondary | ICD-10-CM | POA: Diagnosis not present

## 2023-07-14 DIAGNOSIS — Z01818 Encounter for other preprocedural examination: Secondary | ICD-10-CM | POA: Insufficient documentation

## 2023-07-14 DIAGNOSIS — I4819 Other persistent atrial fibrillation: Secondary | ICD-10-CM

## 2023-07-14 DIAGNOSIS — M109 Gout, unspecified: Secondary | ICD-10-CM | POA: Diagnosis not present

## 2023-07-14 DIAGNOSIS — I050 Rheumatic mitral stenosis: Secondary | ICD-10-CM

## 2023-07-14 DIAGNOSIS — I6930 Unspecified sequelae of cerebral infarction: Secondary | ICD-10-CM | POA: Diagnosis not present

## 2023-07-14 DIAGNOSIS — F17210 Nicotine dependence, cigarettes, uncomplicated: Secondary | ICD-10-CM | POA: Insufficient documentation

## 2023-07-14 DIAGNOSIS — Z79899 Other long term (current) drug therapy: Secondary | ICD-10-CM | POA: Diagnosis not present

## 2023-07-14 DIAGNOSIS — I429 Cardiomyopathy, unspecified: Secondary | ICD-10-CM | POA: Diagnosis not present

## 2023-07-14 DIAGNOSIS — I4891 Unspecified atrial fibrillation: Secondary | ICD-10-CM | POA: Diagnosis not present

## 2023-07-14 DIAGNOSIS — I513 Intracardiac thrombosis, not elsewhere classified: Secondary | ICD-10-CM

## 2023-07-14 DIAGNOSIS — I480 Paroxysmal atrial fibrillation: Secondary | ICD-10-CM

## 2023-07-14 HISTORY — PX: TRANSESOPHAGEAL ECHOCARDIOGRAM (CATH LAB): EP1270

## 2023-07-14 HISTORY — DX: Intracardiac thrombosis, not elsewhere classified: I51.3

## 2023-07-14 LAB — POCT I-STAT, CHEM 8
BUN: 10 mg/dL (ref 8–23)
Calcium, Ion: 1.17 mmol/L (ref 1.15–1.40)
Chloride: 100 mmol/L (ref 98–111)
Creatinine, Ser: 1 mg/dL (ref 0.61–1.24)
Glucose, Bld: 160 mg/dL — ABNORMAL HIGH (ref 70–99)
HCT: 35 % — ABNORMAL LOW (ref 39.0–52.0)
Hemoglobin: 11.9 g/dL — ABNORMAL LOW (ref 13.0–17.0)
Potassium: 3.4 mmol/L — ABNORMAL LOW (ref 3.5–5.1)
Sodium: 143 mmol/L (ref 135–145)
TCO2: 27 mmol/L (ref 22–32)

## 2023-07-14 LAB — PROTIME-INR
INR: 2.9 — ABNORMAL HIGH (ref 0.8–1.2)
Prothrombin Time: 30.7 s — ABNORMAL HIGH (ref 11.4–15.2)

## 2023-07-14 LAB — ECHO TEE
AR max vel: 2.43 cm2
AV Area VTI: 2.88 cm2
AV Area mean vel: 2.65 cm2
AV Mean grad: 5 mm[Hg]
AV Peak grad: 11.4 mm[Hg]
Ao pk vel: 1.69 m/s
MV VTI: 2.95 cm2

## 2023-07-14 SURGERY — TRANSESOPHAGEAL ECHOCARDIOGRAM (TEE) (CATHLAB)
Anesthesia: General

## 2023-07-14 MED ORDER — PERFLUTREN LIPID MICROSPHERE
1.0000 mL | INTRAVENOUS | Status: AC | PRN
  Administered 2023-07-14: 2 mL via INTRAVENOUS

## 2023-07-14 MED ORDER — PHENYLEPHRINE 80 MCG/ML (10ML) SYRINGE FOR IV PUSH (FOR BLOOD PRESSURE SUPPORT)
PREFILLED_SYRINGE | INTRAVENOUS | Status: DC | PRN
  Administered 2023-07-14 (×2): 160 ug via INTRAVENOUS

## 2023-07-14 MED ORDER — PROPOFOL 500 MG/50ML IV EMUL
INTRAVENOUS | Status: DC | PRN
  Administered 2023-07-14: 75 ug/kg/min via INTRAVENOUS

## 2023-07-14 MED ORDER — PROPOFOL 10 MG/ML IV BOLUS
INTRAVENOUS | Status: DC | PRN
  Administered 2023-07-14: 50 mg via INTRAVENOUS

## 2023-07-14 MED ORDER — LIDOCAINE 2% (20 MG/ML) 5 ML SYRINGE
INTRAMUSCULAR | Status: DC | PRN
  Administered 2023-07-14: 60 mg via INTRAVENOUS

## 2023-07-14 NOTE — Discharge Instructions (Signed)

## 2023-07-14 NOTE — Interval H&P Note (Signed)
History and Physical Interval Note:  07/14/2023 8:40 AM  James Paul  has presented today for surgery, with the diagnosis of afib, cva, cardio myopathy.  The various methods of treatment have been discussed with the patient and family. After consideration of risks, benefits and other options for treatment, the patient has consented to  Procedure(s): TRANSESOPHAGEAL ECHOCARDIOGRAM (N/A) CARDIOVERSION (CATH LAB) (N/A) as a surgical intervention.  The patient's history has been reviewed, patient examined, no change in status, stable for surgery.  I have reviewed the patient's chart and labs.  Questions were answered to the patient's satisfaction.    NPO for TEE/DCCV. On warfarin. INR pending. Therapeutic last 2 months.   Gerri Spore T. Flora Lipps, MD, [facility]  [facility]  [address] [place], [place] [address] [phone]  8:40 AM

## 2023-07-14 NOTE — Anesthesia Preprocedure Evaluation (Signed)
Anesthesia Evaluation  Patient identified by MRN, date of birth, ID band Patient awake    Reviewed: Allergy & Precautions, H&P , NPO status , Patient's Chart, lab work & pertinent test results  History of Anesthesia Complications Negative for: history of anesthetic complications  Airway Mallampati: III  TM Distance: >3 FB Neck ROM: Full    Dental  (+) Edentulous Upper, Partial Lower, Dental Advisory Given   Pulmonary neg shortness of breath, sleep apnea and Continuous Positive Airway Pressure Ventilation , neg COPD, former smoker   breath sounds clear to auscultation       Cardiovascular hypertension, Pt. on medications +CHF  + dysrhythmias Atrial Fibrillation + Valvular Problems/Murmurs AS  Rhythm:Regular + Systolic murmurs 1. Rhythm strip during this exam demonstrates atrial fibrillation.   2. Technically difficult study.   3. Left ventricular ejection fraction, by estimation, is 50 to 55%. The  left ventricle has low normal function. Left ventricular endocardial  border not optimally defined to evaluate regional wall motion. Left  ventricular diastolic parameters are  indeterminate.   4. Right ventricular systolic function is normal. The right ventricular  size is not well visualized.   5. Left atrial size was mildly dilated.   6. The mitral valve was not well visualized. No evidence of mitral valve  regurgitation. The mean mitral valve gradient is 6.0 mmHg with average  heart rate of 76 bpm. There is a 25 mm present in the mitral position.  Procedure Date: 2019.   7. The aortic valve was not well visualized. Aortic valve regurgitation  is not visualized. There is a 23 mm St. Jude valve present in the aortic  position. Procedure Date: 2018. Aortic valve mean gradient measures 6.8  mmHg. Aortic valve Vmax measures  1.80 m/s.     Neuro/Psych CVA  negative psych ROS   GI/Hepatic Neg liver ROS,GERD  Medicated and  Controlled,,  Endo/Other  diabetes, Well Controlled, Type 2, Oral Hypoglycemic Agents  Morbid obesity  Renal/GU negative Renal ROS     Musculoskeletal  (+) Arthritis ,    Abdominal   Peds  Hematology  (+) Blood dyscrasia, anemia   Anesthesia Other Findings   Reproductive/Obstetrics                              Anesthesia Physical Anesthesia Plan  ASA: III  Anesthesia Plan: General   Post-op Pain Management:    Induction: Intravenous  PONV Risk Score and Plan:   Airway Management Planned: Oral ETT  Additional Equipment: None  Intra-op Plan:   Post-operative Plan: Extubation in OR  Informed Consent: I have reviewed the patients History and Physical, chart, labs and discussed the procedure including the risks, benefits and alternatives for the proposed anesthesia with the patient or authorized representative who has indicated his/her understanding and acceptance.     Dental advisory given  Plan Discussed with: CRNA and Surgeon  Anesthesia Plan Comments:          Anesthesia Quick Evaluation

## 2023-07-14 NOTE — Transfer of Care (Signed)
Immediate Anesthesia Transfer of Care Note  Patient: James Paul  Procedure(s) Performed: TRANSESOPHAGEAL ECHOCARDIOGRAM INVASIVE LAB ABORTED CASE  Patient Location: Cath Lab  Anesthesia Type:MAC  Level of Consciousness: drowsy and patient cooperative  Airway & Oxygen Therapy: Patient Spontanous Breathing and Patient connected to nasal cannula oxygen  Post-op Assessment: Report given to RN, Post -op Vital signs reviewed and stable, and Patient moving all extremities X 4  Post vital signs: Reviewed and stable  Last Vitals:  Vitals Value Taken Time  BP 101/63   Temp    Pulse 65   Resp 14   SpO2 100     Last Pain:  Vitals:   07/14/23 0800  TempSrc: Temporal  PainSc: 0-No pain         Complications: No notable events documented.

## 2023-07-14 NOTE — Progress Notes (Signed)
Pt is unsure of exactly which meds he took when, informed this RN to call wife, attempted x2 with no response, pt states he has been taking Coumadin regularly with no missed doses

## 2023-07-14 NOTE — Progress Notes (Signed)
  Echocardiogram Echocardiogram Transesophageal has been performed.  Delcie Roch 07/14/2023, 2:12 PM

## 2023-07-14 NOTE — Plan of Care (Signed)
CHL Tonsillectomy/Adenoidectomy, Postoperative PEDS care plan entered in error.

## 2023-07-14 NOTE — Anesthesia Postprocedure Evaluation (Addendum)
Anesthesia Post Note  Patient: James Paul  Procedure(s) Performed: TRANSESOPHAGEAL ECHOCARDIOGRAM INVASIVE LAB ABORTED CASE     Patient location during evaluation: PACU Anesthesia Type: MAC Level of consciousness: awake and alert Pain management: pain level controlled Vital Signs Assessment: post-procedure vital signs reviewed and stable Respiratory status: spontaneous breathing, nonlabored ventilation, respiratory function stable and patient connected to nasal cannula oxygen Cardiovascular status: stable and blood pressure returned to baseline Postop Assessment: no apparent nausea or vomiting Anesthetic complications: no   No notable events documented.  Last Vitals:  Vitals:   07/14/23 1000 07/14/23 1010  BP: (!) 143/62 (!) 142/71  Pulse: 67 63  Resp: (!) 24 18  Temp:    SpO2: 96% 97%    Last Pain:  Vitals:   07/14/23 0944  TempSrc:   PainSc: 0-No pain                 [employer] [employer] [place]

## 2023-07-14 NOTE — CV Procedure (Signed)
    TRANSESOPHAGEAL ECHOCARDIOGRAM   NAME:  James Paul    MRN: [id number] DOB:  May 16, 1946    ADMIT DATE: 07/14/2023  INDICATIONS: Atrial fibrillation   PROCEDURE:   Informed consent was obtained prior to the procedure. The risks, benefits and alternatives for the procedure were discussed and the patient comprehended these risks.  Risks include, but are not limited to, cough, sore throat, vomiting, nausea, somnolence, esophageal and stomach trauma or perforation, bleeding, low blood pressure, aspiration, pneumonia, infection, trauma to the teeth and death.    Procedural time out performed. The oropharynx was anesthetized with topical 1% benzocaine.    Anesthesia was administered by Dr. Charlynn Grimes.  The patient's heart rate, blood pressure, and oxygen saturation are monitored continuously during the procedure. The period of conscious sedation is 15 minutes, of which I was present face-to-face 100% of this time.   The transesophageal probe was inserted in the esophagus and stomach without difficulty and multiple views were obtained.   COMPLICATIONS:    There were no immediate complications.  KEY FINDINGS:  LAA thrombus present. DCCV not performed.  LVEF 45-50%, global HK.  Normal mechanical AoV and MV prostheses.  Full report to follow. Further management per primary team.   Gerri Spore T. Flora Lipps, MD, [facility]  [facility]  [address] [place], [place] [address] [phone]  9:49 AM

## 2023-07-17 ENCOUNTER — Encounter (HOSPITAL_COMMUNITY): Payer: Self-pay | Admitting: Cardiovascular Disease

## 2023-07-18 ENCOUNTER — Telehealth: Payer: Self-pay

## 2023-07-18 ENCOUNTER — Ambulatory Visit: Payer: Medicare PPO | Attending: Cardiology

## 2023-07-18 DIAGNOSIS — Z952 Presence of prosthetic heart valve: Secondary | ICD-10-CM

## 2023-07-18 DIAGNOSIS — Z5181 Encounter for therapeutic drug level monitoring: Secondary | ICD-10-CM | POA: Diagnosis not present

## 2023-07-18 DIAGNOSIS — D696 Thrombocytopenia, unspecified: Secondary | ICD-10-CM

## 2023-07-18 LAB — POCT INR: INR: 3.9 — AB (ref 2.0–3.0)

## 2023-07-18 MED ORDER — WARFARIN SODIUM 5 MG PO TABS: ORAL_TABLET | ORAL | 1 refills | Status: DC

## 2023-07-18 NOTE — Patient Instructions (Signed)
Description   Eat greens today and continue taking 1 tablet daily EXCEPT 1/2 tablet on Sundays, Tuesdays, and Thursdays.  Stay consistent with greens each week (2 servings per week)  Recheck INR in 1 week.  Coumadin Clinic [phone] Cardiac Clearance Fax #[phone]

## 2023-07-18 NOTE — Telephone Encounter (Signed)
Spoke with pt regarding lab results per Dr. Vanetta Shawl note. Pt agreed to come in for CBC before next appt on Friday. Routed to PCP.

## 2023-07-20 ENCOUNTER — Encounter: Payer: Self-pay | Admitting: Cardiology

## 2023-07-20 LAB — CBC
Hematocrit: 34.7 % — ABNORMAL LOW (ref 37.5–51.0)
Hemoglobin: 11.3 g/dL — ABNORMAL LOW (ref 13.0–17.7)
MCH: 29.8 pg (ref 26.6–33.0)
MCHC: 32.6 g/dL (ref 31.5–35.7)
MCV: 92 fL (ref 79–97)
Platelets: 92 10*3/uL — CL (ref 150–450)
RBC: 3.79 x10E6/uL — ABNORMAL LOW (ref 4.14–5.80)
RDW: 15.5 % — ABNORMAL HIGH (ref 11.6–15.4)
WBC: 6 10*3/uL (ref 3.4–10.8)

## 2023-07-21 ENCOUNTER — Ambulatory Visit: Payer: Medicare PPO | Attending: Cardiology | Admitting: Cardiology

## 2023-07-21 ENCOUNTER — Encounter: Payer: Self-pay | Admitting: Cardiology

## 2023-07-21 VITALS — BP 134/74 | HR 92 | Ht 68.0 in | Wt 217.0 lb

## 2023-07-21 DIAGNOSIS — I10 Essential (primary) hypertension: Secondary | ICD-10-CM

## 2023-07-21 DIAGNOSIS — Z952 Presence of prosthetic heart valve: Secondary | ICD-10-CM

## 2023-07-21 DIAGNOSIS — I4819 Other persistent atrial fibrillation: Secondary | ICD-10-CM | POA: Diagnosis not present

## 2023-07-21 NOTE — Patient Instructions (Addendum)
Medication Instructions:  Your physician recommends that you continue on your current medications as directed. Please refer to the Current Medication list given to you today.  *If you need a refill on your cardiac medications before your next appointment, please call your pharmacy*   Lab Work: Your physician recommends that you return for lab work in: Repeat CBC in 2 weeks. No appointment needed.  If you have labs (blood work) drawn today and your tests are completely normal, you will receive your results only by: MyChart Message (if you have MyChart) OR A paper copy in the mail If you have any lab test that is abnormal or we need to change your treatment, we will call you to review the results.   Testing/Procedures: None   Follow-Up: At [facility], you and your health needs are our priority.  As part of our continuing mission to provide you with exceptional heart care, we have created designated Provider Care Teams.  These Care Teams include your primary Cardiologist (physician) and Advanced Practice Providers (APPs -  Physician Assistants and Nurse Practitioners) who all work together to provide you with the care you need, when you need it.  We recommend signing up for the patient portal called "MyChart".  Sign up information is provided on this After Visit Summary.  MyChart is used to connect with patients for Virtual Visits (Telemedicine).  Patients are able to view lab/test results, encounter notes, upcoming appointments, etc.  Non-urgent messages can be sent to your provider as well.   To learn more about what you can do with MyChart, go to [email or web address].    Your next appointment:   6 week(s)  Provider:   Gypsy Balsam, MD    Other Instructions

## 2023-07-21 NOTE — Addendum Note (Signed)
Addended by: Heywood Bene on: 07/21/2023 04:14 PM   Modules accepted: Orders

## 2023-07-21 NOTE — Progress Notes (Signed)
Cardiology Office Note:    Date:  07/21/2023   ID:  James Paul, DOB [birth year], MRN [id number]  PCP:  Crisoforo Oxford, DO  Cardiologist:  Gypsy Balsam, MD    Referring MD: Crisoforo Oxford, DO   Chief Complaint  Patient presents with   Results    History of Present Illness:    James Paul is a 78 y.o. male with past medical history significant for mitral valve and aortic valve replacement he does have surgery with prosthesis 23 mm in aortic position and 25 mm of mitral position.  That was done in 2019 and [place] in [place].  Additional problem include Coumadin anticoagulation, essential hypertension, hyperlipidemia, peripheral vascular disease, smoking, recent hospital with CVA, it was felt to be related to have fluctuation of INR.  Since that time he has been follow-up in my clinic.  He have problem with atrial fibrillation as well as incoming ENT surgery.  TEE was done with intention to cardiovert him to sinus rhythm, however it revealed thrombus in the left atrial appendage.  We canceled the surgery and plan will be to anticoagulate him with INR between 3 and 3.5 and then potentially proceeding with surgery or maybe repeating TEE and attempting cardioversion.  Overall he says he is doing fine denies have any chest pain tightness squeezing pressure burning chest.  Past Medical History:  Diagnosis Date   Anemia, unspecified 03/06/2023   Aortic valve replaced 03/14/2023   Arthritis    Back   Atrial fibrillation (HCC) 03/06/2023   Cataract    B/L early cataracts   Cerebral infarction [facility]) 03/06/2023   Feb 01, 2023 Entered By: Crisoforo Oxford Comment: RUE weakness + facial droop-[facility],see jlv.     CHF (congestive heart failure) (HCC)    Cholesterol retinal embolus of 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   Cigarette smoker 07/14/2017   Compression fracture of vertebral column (HCC) 05/31/2023   Diabetes mellitus without complication (HCC)    Dysrhythmia    A. 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   Mitral valve stenosis 03/06/2023   Jun 24, 2020 Entered By: Sheryn Bison Comment: s/p mech mitral valve in 2017 @ [facility]     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 mellitus (HCC) 03/06/2023   Nicotine dependence 03/06/2023   Obesity 03/06/2023   Obstructive sleep apnea (adult) (pediatric) 03/06/2023   Primary open-angle glaucoma, bilateral, 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   Retinal artery branch occlusion, 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   Retinal ischemia 03/06/2023   Seasonal allergies    Sleep apnea    Solitary pulmonary nodule 07/13/2017   CT at [facility] [place] req 07/13/2017   - Spirometry 07/13/2017  FEV1 1.57 (50%)  Ratio 81      Special screening for malignant neoplasms, colon 03/06/2023   Oct 08, 2013 Entered By: Crisoforo Oxford Comment: Normal colonoscopy 12-19-12-see gi consult     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, 2018 and 03/06/2023   Stroke (HCC)  01/06/2023   weakness right arm   Tobacco use disorder 03/06/2023   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 without macular edema, unspecified eye (HCC) 03/06/2023   Wears glasses     Past Surgical History:  Procedure Laterality Date   BACK SURGERY  1984   Lumbar Spine   CARDIAC VALVE REPLACEMENT  2018   Aortic and Mitral Valve Replacement   COLONOSCOPY  2013   INGUINAL HERNIA REPAIR Left    x2   PAROTIDECTOMY Left 08/08/2014   Procedure: PAROTIDECTOMY;  Surgeon: Suzanna Obey, MD;   [facility];  Service: ENT;  Laterality: Left;   TRANSESOPHAGEAL ECHOCARDIOGRAM (CATH LAB) N/A 07/14/2023   Procedure: TRANSESOPHAGEAL ECHOCARDIOGRAM;  Surgeon: Sande Rives, MD;  Location: [facility] INVASIVE CV LAB;  Service: Cardiovascular;  Laterality: N/A;    Current Medications: Current Meds  Medication Sig   atorvastatin (LIPITOR) 40 MG tablet Take 40 mg by mouth daily.   Cyanocobalamin (B-12) 2500 MCG TABS Take 2,500 mcg by mouth daily.   latanoprost (XALATAN) 0.005 % ophthalmic solution Place 1 drop into both eyes at bedtime.   metoprolol tartrate (LOPRESSOR) 50 MG tablet Take 1 tablet (50 mg total) by mouth 2 (two) times daily.   pantoprazole (PROTONIX) 40 MG tablet Take 40 mg by mouth daily.   potassium chloride (KLOR-CON) 10 MEQ tablet Take 1 tablet (10 mEq total) by mouth as directed. Take 4 tabs (40 meq) today (10/16) and then 1 tab a day starting tomorrow (10/17)   sacubitril-valsartan (ENTRESTO) 49-51 MG Take 1 tablet by mouth 2 (two) times daily.   sitaGLIPtin (JANUVIA) 25 MG tablet Take 25 mg by mouth daily.   tiZANidine (ZANAFLEX) 4 MG tablet Take 4 mg by mouth at bedtime.   warfarin (COUMADIN) 5 MG tablet TAKE 1/2 TABLET TO 1 TABLET BY MOUTH DAILY AS DIRECTED BY COUMADIN CLINIC (Patient taking differently: Take 2.5-5 mg by mouth See admin instructions. TAKE 1/2 TABLET TO 1 TABLET BY MOUTH DAILY AS DIRECTED BY COUMADIN CLINIC)     Allergies:   Patient has no known allergies.   Social History   Socioeconomic History   Marital status: Married    Spouse name: Not on file   Number of children: 1   Years of education: Not on file   Highest education level: Not on file  Occupational History   Not on file  Tobacco Use   Smoking status: Former    Average packs/day: 0.5 packs/day for 56.2 years (28.1 ttl pk-yrs)    Types: Cigarettes    Start date: 1968   Smokeless tobacco: Never  Vaping Use   Vaping status: Never Used  Substance and Sexual Activity   Alcohol  use: Not Currently    Comment: Rare beer   Drug use: No   Sexual activity: Yes  Other Topics Concern   Not on file  Social History Narrative   Not on file   Social Determinants of Health   Financial Resource Strain: Not on file  Food Insecurity: Low Risk  (05/17/2023)   Received from [facility]   Hunger Vital Sign    Worried About Running Out of Food in the Last Year: Never true    Ran Out of Food in the Last Year: Never true  Transportation Needs: Not on file (05/17/2023)  Physical Activity: Not on file  Stress: Not on file  Social Connections: Not on file     Family History: The patient's family history includes Aneurysm in his  father; Congestive Heart Failure in his mother and sister. ROS:   Please see the history of present illness.    All 14 point review of systems negative except as described per history of present illness  EKGs/Labs/Other Studies Reviewed:         Recent Labs: 07/14/2023: BUN 10; Creatinine, Ser 1.00; Potassium 3.4; Sodium 143 07/19/2023: Hemoglobin 11.3; Platelets 92  Recent Lipid Panel No results found for: "CHOL", "TRIG", "HDL", "CHOLHDL", "VLDL", "LDLCALC", "LDLDIRECT"  Physical Exam:    VS:  BP 134/74 (BP Location: Left Arm, Patient Position: Sitting)   Pulse 92   Ht 5\' 8"  (1.727 m)   Wt 217 lb (98.4 kg)   SpO2 98%   BMI 32.99 kg/m     Wt Readings from Last 3 Encounters:  07/21/23 217 lb (98.4 kg)  07/14/23 214 lb (97.1 kg)  07/11/23 216 lb 4.8 oz (98.1 kg)     GEN:  Well nourished, well developed in no acute distress HEENT: Normal NECK: No JVD; No carotid bruits LYMPHATICS: No lymphadenopathy CARDIAC: Irregularly regular, crisp mechanical valve sounds.  No murmurs, no rubs, no gallops RESPIRATORY:  Clear to auscultation without rales, wheezing or rhonchi  ABDOMEN: Soft, non-tender, non-distended MUSCULOSKELETAL:  No edema; No deformity  SKIN: Warm and dry LOWER EXTREMITIES: no swelling NEUROLOGIC:  Alert and oriented x  3 PSYCHIATRIC:  Normal affect   ASSESSMENT:    1. Essential hypertension   2. Persistent atrial fibrillation (HCC)   3. 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 H/O mitral valve replacement 25 mm changes done at [facility] 2018    PLAN:    In order of problems listed above:  Left atrial thrombus.  Will intensify anticoagulation 3-3.5 will be target of INR.  After that we may consider repeating transesophageal echocardiogram simply letting him go to the surgery with anticoagulation being interrupted for shortest possible time. Atrial fibrillation seems to be controlled.  Continue anticoagulation with instruction above. Status post mitral and aortic valve replacement valve seems to be functioning properly.    Medication Adjustments/Labs and Tests Ordered: Current medicines are reviewed at length with the patient today.  Concerns regarding medicines are outlined above.  Orders Placed This Encounter  Procedures   EKG 12-Lead   Medication changes: No orders of the defined types were placed in this encounter.   Signed, Georgeanna Lea, MD, [facility] 07/21/2023 4:04 PM    [facility]

## 2023-07-24 ENCOUNTER — Encounter (HOSPITAL_COMMUNITY): Payer: Self-pay | Admitting: Physician Assistant

## 2023-07-25 ENCOUNTER — Ambulatory Visit: Payer: Medicare PPO | Attending: Cardiology

## 2023-07-25 DIAGNOSIS — Z952 Presence of prosthetic heart valve: Secondary | ICD-10-CM | POA: Diagnosis not present

## 2023-07-25 DIAGNOSIS — Z5181 Encounter for therapeutic drug level monitoring: Secondary | ICD-10-CM | POA: Diagnosis not present

## 2023-07-25 LAB — POCT INR: INR: 1.9 — AB (ref 2.0–3.0)

## 2023-07-25 NOTE — Patient Instructions (Signed)
Description   Take 1 tablet today and 1.5 tablets tomorrow and then START taking 1 tablet daily EXCEPT 1/2 tablet on Tuesdays, and Thursdays.  Stay consistent with greens each week (2 servings per week)  Recheck INR in 1 week.  [facility] [phone] Cardiac Clearance Fax #[phone]

## 2023-07-26 ENCOUNTER — Other Ambulatory Visit: Payer: Self-pay | Admitting: Otolaryngology

## 2023-08-01 ENCOUNTER — Ambulatory Visit: Payer: Medicare PPO | Attending: Cardiology

## 2023-08-01 DIAGNOSIS — Z952 Presence of prosthetic heart valve: Secondary | ICD-10-CM | POA: Diagnosis not present

## 2023-08-01 DIAGNOSIS — Z7901 Long term (current) use of anticoagulants: Secondary | ICD-10-CM

## 2023-08-01 DIAGNOSIS — Z5181 Encounter for therapeutic drug level monitoring: Secondary | ICD-10-CM | POA: Diagnosis not present

## 2023-08-01 LAB — POCT INR: INR: 4.8 — AB (ref 2.0–3.0)

## 2023-08-01 NOTE — Patient Instructions (Signed)
Description   HOLD today's dose and eat a serving of greens and then START taking 1 tablet daily EXCEPT 1/2 tablet on Sundays, Tuesdays, and Thursdays.  Stay consistent with greens each week (2 servings per week)  Recheck INR in 1 week.  [facility] [phone] Cardiac Clearance Fax #[phone]

## 2023-08-02 ENCOUNTER — Encounter (HOSPITAL_COMMUNITY): Admission: RE | Payer: Self-pay | Source: Ambulatory Visit

## 2023-08-02 ENCOUNTER — Ambulatory Visit (HOSPITAL_COMMUNITY): Admission: RE | Admit: 2023-08-02 | Payer: Medicare PPO | Source: Ambulatory Visit | Admitting: Otolaryngology

## 2023-08-02 LAB — CBC
Hematocrit: 33.8 % — ABNORMAL LOW (ref 37.5–51.0)
Hemoglobin: 11.1 g/dL — ABNORMAL LOW (ref 13.0–17.7)
MCH: 29.6 pg (ref 26.6–33.0)
MCHC: 32.8 g/dL (ref 31.5–35.7)
MCV: 90 fL (ref 79–97)
Platelets: 95 10*3/uL — CL (ref 150–450)
RBC: 3.75 x10E6/uL — ABNORMAL LOW (ref 4.14–5.80)
RDW: 15 % (ref 11.6–15.4)
WBC: 5.4 10*3/uL (ref 3.4–10.8)

## 2023-08-02 LAB — BASIC METABOLIC PANEL
BUN/Creatinine Ratio: 7 — ABNORMAL LOW (ref 10–24)
BUN: 7 mg/dL — ABNORMAL LOW (ref 8–27)
CO2: 21 mmol/L (ref 20–29)
Calcium: 7.8 mg/dL — ABNORMAL LOW (ref 8.6–10.2)
Chloride: 104 mmol/L (ref 96–106)
Creatinine, Ser: 0.95 mg/dL (ref 0.76–1.27)
Glucose: 176 mg/dL — ABNORMAL HIGH (ref 70–99)
Potassium: 3.4 mmol/L — ABNORMAL LOW (ref 3.5–5.2)
Sodium: 142 mmol/L (ref 134–144)
eGFR: 83 mL/min/{1.73_m2} (ref >59)

## 2023-08-02 SURGERY — PAROTIDECTOMY
Anesthesia: General | Laterality: Right

## 2023-08-04 ENCOUNTER — Telehealth: Payer: Self-pay

## 2023-08-04 DIAGNOSIS — E876 Hypokalemia: Secondary | ICD-10-CM

## 2023-08-04 NOTE — Telephone Encounter (Signed)
Spoke with spouse regarding lab results. Spouse stated that he was taking of potassium. Advised per Dr. Bing Matter to increase to for 1 week then come for labs. She verbalized understanding and had no further questions.

## 2023-08-04 NOTE — Telephone Encounter (Signed)
Left message on My Chart with normal results per Dr. Krasowski's note. Routed to PCP. 

## 2023-08-04 NOTE — Telephone Encounter (Signed)
Lab Results reviewed with spouse as per Dr. Vanetta Shawl note. Spouse verbalized understanding and had no additional questions. Routed to PCP

## 2023-08-08 ENCOUNTER — Ambulatory Visit: Payer: Medicare PPO | Attending: Cardiology

## 2023-08-08 DIAGNOSIS — Z7901 Long term (current) use of anticoagulants: Secondary | ICD-10-CM

## 2023-08-08 DIAGNOSIS — Z5181 Encounter for therapeutic drug level monitoring: Secondary | ICD-10-CM

## 2023-08-08 DIAGNOSIS — Z952 Presence of prosthetic heart valve: Secondary | ICD-10-CM

## 2023-08-08 LAB — POCT INR: INR: 2.5 (ref 2.0–3.0)

## 2023-08-08 NOTE — Patient Instructions (Addendum)
Description   Take 1 tablet today and then continue taking 1 tablet daily EXCEPT 1/2 tablet on Sundays, Tuesdays, and Thursdays.  Stay consistent with greens each week (2 servings per week)  Recheck INR in 1 week.  [facility] [phone] Cardiac Clearance Fax #[phone]

## 2023-08-15 ENCOUNTER — Ambulatory Visit: Payer: Medicare PPO | Attending: Cardiology

## 2023-08-15 DIAGNOSIS — Z952 Presence of prosthetic heart valve: Secondary | ICD-10-CM

## 2023-08-15 DIAGNOSIS — I4819 Other persistent atrial fibrillation: Secondary | ICD-10-CM

## 2023-08-15 DIAGNOSIS — Z5181 Encounter for therapeutic drug level monitoring: Secondary | ICD-10-CM

## 2023-08-15 DIAGNOSIS — Z7901 Long term (current) use of anticoagulants: Secondary | ICD-10-CM

## 2023-08-15 LAB — POCT INR: INR: 2.6 (ref 2.0–3.0)

## 2023-08-15 NOTE — Patient Instructions (Signed)
Description   Take 2 tablets today and then START taking 1 tablet daily EXCEPT 1/2 tablet on Thursdays.  Stay consistent with greens each week (2 servings per week)  Recheck INR in 2 weeks.  [facility] [phone] Cardiac Clearance Fax #[phone]

## 2023-08-16 LAB — BASIC METABOLIC PANEL
BUN/Creatinine Ratio: 12 (ref 10–24)
BUN: 12 mg/dL (ref 8–27)
CO2: 22 mmol/L (ref 20–29)
Calcium: 9 mg/dL (ref 8.6–10.2)
Chloride: 104 mmol/L (ref 96–106)
Creatinine, Ser: 1 mg/dL (ref 0.76–1.27)
Glucose: 192 mg/dL — ABNORMAL HIGH (ref 70–99)
Potassium: 3.7 mmol/L (ref 3.5–5.2)
Sodium: 143 mmol/L (ref 134–144)
eGFR: 78 mL/min/{1.73_m2} (ref >59)

## 2023-08-17 ENCOUNTER — Telehealth: Payer: Self-pay

## 2023-08-17 NOTE — Telephone Encounter (Signed)
Left message on My Chart with normal lab results per Dr. Krasowski's note. Routed to PCP.  

## 2023-08-22 DIAGNOSIS — R109 Unspecified abdominal pain: Secondary | ICD-10-CM

## 2023-08-22 HISTORY — DX: Unspecified abdominal pain: R10.9

## 2023-08-29 ENCOUNTER — Ambulatory Visit: Payer: Medicare PPO | Attending: Cardiology

## 2023-08-29 DIAGNOSIS — Z952 Presence of prosthetic heart valve: Secondary | ICD-10-CM

## 2023-08-29 DIAGNOSIS — Z5181 Encounter for therapeutic drug level monitoring: Secondary | ICD-10-CM

## 2023-08-29 LAB — POCT INR: INR: 3.9 — AB (ref 2.0–3.0)

## 2023-08-29 NOTE — Patient Instructions (Signed)
Description   Only take 1/2 tablet today and then continue taking 1 tablet daily EXCEPT 1/2 tablet on Thursdays.  Stay consistent with greens each week (2 servings per week)  Recheck INR in 5 weeks.  [facility] [phone] Cardiac Clearance Fax #[phone]

## 2023-08-30 ENCOUNTER — Ambulatory Visit: Payer: Medicare PPO | Attending: Cardiology | Admitting: Cardiology

## 2023-08-30 ENCOUNTER — Encounter: Payer: Self-pay | Admitting: Cardiology

## 2023-08-30 VITALS — BP 146/84 | HR 64 | Ht 68.0 in | Wt 216.0 lb

## 2023-08-30 DIAGNOSIS — I10 Essential (primary) hypertension: Secondary | ICD-10-CM | POA: Diagnosis not present

## 2023-08-30 DIAGNOSIS — M545 Low back pain, unspecified: Secondary | ICD-10-CM

## 2023-08-30 DIAGNOSIS — Z952 Presence of prosthetic heart valve: Secondary | ICD-10-CM

## 2023-08-30 DIAGNOSIS — I4819 Other persistent atrial fibrillation: Secondary | ICD-10-CM | POA: Diagnosis not present

## 2023-08-30 HISTORY — DX: Low back pain, unspecified: M54.50

## 2023-08-30 NOTE — Progress Notes (Unsigned)
Cardiology Office Note:    Date:  08/30/2023   ID:  DHIREN CARCHIDI, DOB 12/19/1945, MRN [id number]  PCP:  Crisoforo Oxford, DO  Cardiologist:  Gypsy Balsam, MD    Referring MD: Crisoforo Oxford, DO   Chief Complaint  Patient presents with   Follow-up    History of Present Illness:    James Paul is a 77 y.o. male  with past medical history significant for mitral valve and aortic valve replacement he does have surgery with prosthesis 23 mm in aortic position and 25 mm of mitral position. That was done in 2019 and [place] in [place]. Additional problem include Coumadin anticoagulation, essential hypertension, hyperlipidemia, peripheral vascular disease, smoking, recent hospital with CVA, it was felt to be related to have fluctuation of INR. Since that time he has been follow-up in my clinic. He have problem with atrial fibrillation as well as incoming ENT surgery. TEE was done with intention to cardiovert him to sinus rhythm, however it revealed thrombus in the left atrial appendage. We canceled the surgery and plan will be to anticoagulate him with INR between 3 and 3.5 and then potentially proceeding with surgery  Comes today to months for follow-up.  He did maintain his INR elevated however 2 weeks ago 2.6, yesterday 3.9.  Will continue higher INR.  He need to have parathyroid surgery if that is what needed he need to be put on Heparin brought to the hospital or Lovenox then anticoagulation would hold for the day of surgery and then restart as quickly as feasible from surgical point of view. Comes today to my office doing well denies have any chest pain tightness squeezing pressure burning chest no palpitation dizziness swelling of lower extremities  Past Medical History:  Diagnosis Date   Anemia, unspecified 03/06/2023   Aortic valve replaced 03/14/2023   Arthritis    Back   Atrial fibrillation (HCC) 03/06/2023   Cataract    B/L early cataracts   Cerebral infarction [facility])  03/06/2023   Feb 01, 2023 Entered By: Crisoforo Oxford Comment: RUE weakness + facial droop-[facility],see jlv.     CHF (congestive heart failure) (HCC)    Cholesterol retinal embolus of 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   Cigarette smoker 07/14/2017   Compression fracture of vertebral column (HCC) 05/31/2023   Diabetes mellitus without complication (HCC)    Dysrhythmia    A. 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   Mitral valve stenosis 03/06/2023   Jun 24, 2020 Entered By: Sheryn Bison Comment: s/p mech mitral valve in 2017 @ [facility]     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 mellitus (HCC) 03/06/2023   Nicotine dependence 03/06/2023   Obesity 03/06/2023   Obstructive sleep apnea (adult) (pediatric) 03/06/2023   Primary open-angle glaucoma, bilateral, 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   Retinal artery branch occlusion, 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   Retinal ischemia 03/06/2023   Seasonal allergies    Sleep apnea    Solitary pulmonary nodule 07/13/2017   CT at [facility]  [facility] [place] req 07/13/2017   - Spirometry 07/13/2017  FEV1 1.57 (50%)  Ratio 81      Special screening for malignant neoplasms, colon 03/06/2023   Oct 08, 2013 Entered By: Crisoforo Oxford Comment: Normal colonoscopy 12-19-12-see gi consult     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, 2018 and 03/06/2023   Stroke (HCC) 01/06/2023   weakness right arm   Tobacco use disorder 03/06/2023   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  without macular edema, unspecified eye (HCC) 03/06/2023   Wears glasses     Past Surgical History:  Procedure Laterality Date   BACK SURGERY  1984   Lumbar Spine   CARDIAC VALVE REPLACEMENT  2018   Aortic and Mitral Valve Replacement   COLONOSCOPY  2013   INGUINAL HERNIA REPAIR Left    x2   PAROTIDECTOMY Left 08/08/2014   Procedure: PAROTIDECTOMY;  Surgeon: Suzanna Obey, MD;  Location: [facility] OR;  Service: ENT;  Laterality: Left;   TRANSESOPHAGEAL ECHOCARDIOGRAM (CATH LAB) N/A 07/14/2023   Procedure: TRANSESOPHAGEAL ECHOCARDIOGRAM;  Surgeon: Sande Rives, MD;  Location: [facility] INVASIVE CV LAB;  Service: Cardiovascular;  Laterality: N/A;    Current Medications: Current Meds  Medication Sig   atorvastatin (LIPITOR) 40 MG tablet Take 40 mg by mouth daily.   Cyanocobalamin (B-12) 2500 MCG TABS Take 2,500 mcg by mouth daily.   latanoprost (XALATAN) 0.005 % ophthalmic solution Place 1 drop into both eyes at bedtime.   metoprolol tartrate (LOPRESSOR) 50 MG tablet Take 1 tablet (50 mg total) by mouth 2 (two) times daily.   pantoprazole (PROTONIX) 40 MG tablet Take 40 mg by mouth daily.   potassium chloride (KLOR-CON) 10 MEQ tablet Take 1 tablet (10 mEq total) by mouth as directed. Take 4 tabs (40 meq) today (10/16) and then 1 tab a day starting tomorrow (10/17)   sacubitril-valsartan (ENTRESTO) 49-51 MG Take 1 tablet by mouth 2 (two) times daily.   sitaGLIPtin (JANUVIA) 25 MG tablet Take 25 mg by mouth daily.   warfarin (COUMADIN) 5 MG tablet TAKE 1/2 TABLET TO 1 TABLET BY MOUTH DAILY AS DIRECTED BY COUMADIN CLINIC (Patient taking differently: Take 2.5-5 mg by mouth See admin instructions. TAKE 1/2 TABLET TO 1 TABLET BY MOUTH DAILY AS DIRECTED BY COUMADIN CLINIC)   [DISCONTINUED] traMADol (ULTRAM) 50 MG tablet Take 50 mg by mouth 3 (three) times daily.     Allergies:   Patient has no known allergies.   Social History   Socioeconomic History   Marital status: Married    Spouse name: Not  on file   Number of children: 1   Years of education: Not on file   Highest education level: Not on file  Occupational History   Not on file  Tobacco Use   Smoking status: Former    Average packs/day: 0.5 packs/day for 56.2 years (28.1 ttl pk-yrs)    Types: Cigarettes    Start date: 1968   Smokeless tobacco: Never  Vaping Use   Vaping status: Never Used  Substance and Sexual Activity   Alcohol use: Not Currently    Comment: Rare beer   Drug use: No   Sexual activity: Yes  Other Topics Concern   Not on file  Social History Narrative   Not on file   Social Determinants of Health   Financial Resource Strain: Not on file  Food Insecurity: Low Risk  (05/17/2023)   Received from [facility]  Hunger Vital Sign    Worried About Running Out of Food in the Last Year: Never true    Ran Out of Food in the Last Year: Never true  Transportation Needs: Not on file (05/17/2023)  Physical Activity: Not on file  Stress: Not on file  Social Connections: Not on file     Family History: The patient's family history includes Aneurysm in his father; Congestive Heart Failure in his mother and sister. ROS:   Please see the history of present illness.    All 14 point review of systems negative except as described per history of present illness  EKGs/Labs/Other Studies Reviewed:         Recent Labs: 08/01/2023: Hemoglobin 11.1; Platelets 95 08/15/2023: BUN 12; Creatinine, Ser 1.00; Potassium 3.7; Sodium 143  Recent Lipid Panel No results found for: "CHOL", "TRIG", "HDL", "CHOLHDL", "VLDL", "LDLCALC", "LDLDIRECT"  Physical Exam:    VS:  BP (!) 146/84 (BP Location: Right Arm, Patient Position: Sitting, Cuff Size: Normal)   Pulse 64   Ht 5\' 8"  (1.727 m)   Wt 216 lb (98 kg)   SpO2 95%   BMI 32.84 kg/m     Wt Readings from Last 3 Encounters:  08/30/23 216 lb (98 kg)  07/21/23 217 lb (98.4 kg)  07/14/23 214 lb (97.1 kg)     GEN:  Well nourished, well developed in no acute  distress HEENT: Normal NECK: No JVD; No carotid bruits LYMPHATICS: No lymphadenopathy CARDIAC: RRR, crisp mechanical valve sounds heard, no rubs, no gallops RESPIRATORY:  Clear to auscultation without rales, wheezing or rhonchi  ABDOMEN: Soft, non-tender, non-distended MUSCULOSKELETAL:  No edema; No deformity  SKIN: Warm and dry LOWER EXTREMITIES: no swelling NEUROLOGIC:  Alert and oriented x 3 PSYCHIATRIC:  Normal affect   ASSESSMENT:    1. Persistent atrial fibrillation (HCC)   2. Essential hypertension   3. H/O mitral valve replacement 25 mm changes done at [facility] 2018    PLAN:    In order of problems listed above:  Persistent atrial fibrillation, did look at his transesophageal echocardiogram look like it was quite significant enlargement moderate with reaching the point that chances of keeping him normal rhythm are obviously without any extreme pressures.  In terms of his surgery should be fine to proceed with this from cardiac standpoint review he need to be anticoagulated he is on Coumadin right now he need to be either brought to hospital put on heparin IV for few days waiting ending INR drop below 1.8 then proceed with surgery and then restart Coumadin as quickly visible with heparin as well.  Alternative could be Lovenox.  In terms of bring him back to normal rhythm I have more more doubt that this is something that will last most likely will refer him to our EP team for evaluation History of mitral valve aortic valve replacement valve review on transesophageal echocardiogram normal function. Essential hypertension blood pressure elevated we will check before he leaves the room. Cardiomyopathy ejection fraction 4045%.  He is on Entresto he is on metoprolol try continue.  Will check Chem-7 if Chem-7 is fine we will do Aldactone.   Medication Adjustments/Labs and Tests Ordered: Current medicines are reviewed at length with the patient today.  Concerns regarding medicines are  outlined above.  No orders of the defined types were placed in this encounter.  Medication changes: No orders of the defined types were placed in this encounter.   Signed, Georgeanna Lea, MD, [facility] 08/30/2023 3:19 PM  [facility]

## 2023-08-30 NOTE — Patient Instructions (Signed)

## 2023-09-07 ENCOUNTER — Other Ambulatory Visit: Payer: Self-pay | Admitting: Otolaryngology

## 2023-09-12 ENCOUNTER — Ambulatory Visit: Payer: Medicare PPO | Attending: Cardiology

## 2023-09-12 DIAGNOSIS — Z952 Presence of prosthetic heart valve: Secondary | ICD-10-CM

## 2023-09-12 DIAGNOSIS — Z5181 Encounter for therapeutic drug level monitoring: Secondary | ICD-10-CM

## 2023-09-12 LAB — POCT INR: INR: 2.6 (ref 2.0–3.0)

## 2023-09-12 MED ORDER — ENOXAPARIN SODIUM 100 MG/ML IJ SOSY: 100.0000 mg | PREFILLED_SYRINGE | Freq: Two times a day (BID) | INTRAMUSCULAR | 1 refills | Status: DC

## 2023-09-12 NOTE — Patient Instructions (Addendum)
Description   Take 1.5 tablets today and then continue taking 1 tablet daily EXCEPT 1/2 tablet on Thursdays.  On 09/16/23 follow pre/post procedure instructions.  Stay consistent with greens each week (2 servings per week)  Recheck INR 1 week post procedure.   [facility] [phone] Cardiac Clearance Fax #[phone]   12/21: Last dose of warfarin.  12/22: No warfarin or enoxaparin (Lovenox).  12/23: Inject enoxaparin 100mg  in the fatty abdominal tissue at least 2 inches from the belly button twice a day about 12 hours apart, 8am and 8pm rotate sites. No warfarin.  12/24: Inject enoxaparin in the fatty tissue every 12 hours, 8am and 8pm. No warfarin.  12/25: Inject enoxaparin in the fatty tissue every 12 hours, 8am and 8pm. No warfarin.  12/26: Inject enoxaparin in the fatty tissue in the morning at 8 am (No PM dose). No warfarin.  12/27: Procedure Day - No enoxaparin - Resume warfarin in the evening or as directed by doctor (take an extra half tablet with usual dose).   12/28: Resume enoxaparin inject in the fatty tissue every 12 hours and take warfarin (take an extra half tablet with usual dose)   12/29: Inject enoxaparin in the fatty tissue every 12 hours and take warfarin  12/30: Inject enoxaparin in the fatty tissue every 12 hours and take warfarin  12/31: Inject enoxaparin in the fatty tissue every 12 hours and take warfarin  1/1: Inject enoxaparin in the fatty tissue every 12 hours and take warfarin  1/2: Inject enoxaparin in the fatty tissue every 12 hours and take warfarin  1/3: Inject enoxaparin in the fatty tissue  and warfarin appt to check INR.

## 2023-09-14 NOTE — Pre-Procedure Instructions (Signed)
Surgical Instructions   Your procedure is scheduled on September 22, 2023. Report to [facility] Main Entrance "A" at 8:00 A.M., then check in with the Admitting office. Any questions or running late day of surgery: call [phone]  Questions prior to your surgery date: call [phone], Monday-Friday, 8am-4pm. If you experience any cold or flu symptoms such as cough, fever, chills, shortness of breath, etc. between now and your scheduled surgery, please notify us at the above number.     Remember:  Do not eat after midnight the night before your surgery   You may drink clear liquids until 7:00 AM the morning of your surgery.   Clear liquids allowed are: Water, Non-Citrus Juices (without pulp), Carbonated Beverages, Clear Tea (no milk, honey, etc.), Black Coffee Only (NO MILK, CREAM OR POWDERED CREAMER of any kind), and Gatorade.    Take these medicines the morning of surgery with A SIP OF WATER: atorvastatin (LIPITOR)  metoprolol tartrate (LOPRESSOR)  pantoprazole (PROTONIX)    May take these medicines IF NEEDED: acetaminophen (TYLENOL)    Please follow your prescribers instructions for enoxaparin (LOVENOX) injections.   One week prior to surgery, STOP taking any Aspirin (unless otherwise instructed by your surgeon) Aleve, Naproxen, Ibuprofen, Motrin, Advil, Goody's, BC's, all herbal medications, fish oil, and non-prescription vitamins.   WHAT DO I DO ABOUT MY DIABETES MEDICATION?   Do not take sitaGLIPtin (JANUVIA) the morning of surgery.   HOW TO MANAGE YOUR DIABETES BEFORE AND AFTER SURGERY  Why is it important to control my blood sugar before and after surgery? Improving blood sugar levels before and after surgery helps healing and can limit problems. A way of improving blood sugar control is eating a healthy diet by:  Eating less sugar and carbohydrates  Increasing activity/exercise  Talking with your doctor about reaching your blood sugar goals High blood  sugars (greater than 180 mg/dL) can raise your risk of infections and slow your recovery, so you will need to focus on controlling your diabetes during the weeks before surgery. Make sure that the doctor who takes care of your diabetes knows about your planned surgery including the date and location.  How do I manage my blood sugar before surgery? Check your blood sugar at least 4 times a day, starting 2 days before surgery, to make sure that the level is not too high or low.  Check your blood sugar the morning of your surgery when you wake up and every 2 hours until you get to the Short Stay unit.  If your blood sugar is less than 70 mg/dL, you will need to treat for low blood sugar: Do not take insulin. Treat a low blood sugar (less than 70 mg/dL) with  cup of clear juice (cranberry or apple), 4 glucose tablets, OR glucose gel. Recheck blood sugar in 15 minutes after treatment (to make sure it is greater than 70 mg/dL). If your blood sugar is not greater than 70 mg/dL on recheck, call [phone] for further instructions. Report your blood sugar to the short stay nurse when you get to Short Stay.  If you are admitted to the hospital after surgery: Your blood sugar will be checked by the staff and you will probably be given insulin after surgery (instead of oral diabetes medicines) to make sure you have good blood sugar levels. The goal for blood sugar control after surgery is 80-180 mg/dL.  Do NOT Smoke (Tobacco/Vaping) for 24 hours prior to your procedure.  If you use a CPAP at night, you may bring your mask/headgear for your overnight stay.   You will be asked to remove any contacts, glasses, piercing's, hearing aid's, dentures/partials prior to surgery. Please bring cases for these items if needed.    Patients discharged the day of surgery will not be allowed to drive home, and someone needs to stay with them for 24 hours.  SURGICAL WAITING ROOM  VISITATION Patients may have no more than 2 support people in the waiting area - these visitors may rotate.   Pre-op nurse will coordinate an appropriate time for 1 ADULT support person, who may not rotate, to accompany patient in pre-op.  Children under the age of 66 must have an adult with them who is not the patient and must remain in the main waiting area with an adult.  If the patient needs to stay at the hospital during part of their recovery, the visitor guidelines for inpatient rooms apply.  Please refer to the [facility] website for the visitor guidelines for any additional information.   If you received a COVID test during your pre-op visit  it is requested that you wear a mask when out in public, stay away from anyone that may not be feeling well and notify your surgeon if you develop symptoms. If you have been in contact with anyone that has tested positive in the last 10 days please notify you surgeon.      Pre-operative CHG Bathing Instructions   You can play a key role in reducing the risk of infection after surgery. Your skin needs to be as free of germs as possible. You can reduce the number of germs on your skin by washing with CHG (chlorhexidine gluconate) soap before surgery. CHG is an antiseptic soap that kills germs and continues to kill germs even after washing.   DO NOT use if you have an allergy to chlorhexidine/CHG or antibacterial soaps. If your skin becomes reddened or irritated, stop using the CHG and notify one of our RNs at [phone].              TAKE A SHOWER THE NIGHT BEFORE SURGERY AND THE DAY OF SURGERY    Please keep in mind the following:  DO NOT shave, including legs and underarms, 48 hours prior to surgery.   You may shave your face before/day of surgery.  Place clean sheets on your bed the night before surgery Use a clean washcloth (not used since being washed) for each shower. DO NOT sleep with pet's night before surgery.  CHG Shower  Instructions:  Wash your face and private area with normal soap. If you choose to wash your hair, wash first with your normal shampoo.  After you use shampoo/soap, rinse your hair and body thoroughly to remove shampoo/soap residue.  Turn the water OFF and apply half the bottle of CHG soap to a CLEAN washcloth.  Apply CHG soap ONLY FROM YOUR NECK DOWN TO YOUR TOES (washing for 3-5 minutes)  DO NOT use CHG soap on face, private areas, open wounds, or sores.  Pay special attention to the area where your surgery is being performed.  If you are having back surgery, having someone wash your back for you may be helpful. Wait 2 minutes after CHG soap is applied, then you may rinse off the CHG soap.  Pat dry with a clean towel  Put on clean pajamas  Additional instructions for the day of surgery: DO NOT APPLY any lotions, deodorants, cologne, or perfumes.   Do not wear jewelry or makeup Do not wear nail polish, gel polish, artificial nails, or any other type of covering on natural nails (fingers and toes) Do not bring valuables to the hospital. [facility] is not responsible for valuables/personal belongings. Put on clean/comfortable clothes.  Please brush your teeth.  Ask your nurse before applying any prescription medications to the skin.

## 2023-09-15 ENCOUNTER — Encounter (HOSPITAL_COMMUNITY)
Admission: RE | Admit: 2023-09-15 | Discharge: 2023-09-15 | Disposition: A | Discharge status: Home / Self Care | Payer: Medicare PPO | Source: Ambulatory Visit | Attending: Otolaryngology | Admitting: Otolaryngology

## 2023-09-15 ENCOUNTER — Telehealth: Payer: Self-pay

## 2023-09-15 ENCOUNTER — Encounter (HOSPITAL_COMMUNITY): Payer: Self-pay

## 2023-09-15 ENCOUNTER — Other Ambulatory Visit: Payer: Self-pay

## 2023-09-15 VITALS — BP 159/86 | HR 81 | Temp 98.3°F | Resp 17 | Ht 68.0 in | Wt 214.0 lb

## 2023-09-15 DIAGNOSIS — Z7901 Long term (current) use of anticoagulants: Secondary | ICD-10-CM | POA: Insufficient documentation

## 2023-09-15 DIAGNOSIS — E119 Type 2 diabetes mellitus without complications: Secondary | ICD-10-CM

## 2023-09-15 DIAGNOSIS — Z8673 Personal history of transient ischemic attack (TIA), and cerebral infarction without residual deficits: Secondary | ICD-10-CM | POA: Diagnosis not present

## 2023-09-15 DIAGNOSIS — I110 Hypertensive heart disease with heart failure: Secondary | ICD-10-CM | POA: Insufficient documentation

## 2023-09-15 DIAGNOSIS — G4733 Obstructive sleep apnea (adult) (pediatric): Secondary | ICD-10-CM | POA: Insufficient documentation

## 2023-09-15 DIAGNOSIS — R011 Cardiac murmur, unspecified: Secondary | ICD-10-CM | POA: Diagnosis not present

## 2023-09-15 DIAGNOSIS — E7800 Pure hypercholesterolemia, unspecified: Secondary | ICD-10-CM | POA: Diagnosis not present

## 2023-09-15 DIAGNOSIS — Z87891 Personal history of nicotine dependence: Secondary | ICD-10-CM | POA: Insufficient documentation

## 2023-09-15 DIAGNOSIS — Z01812 Encounter for preprocedural laboratory examination: Secondary | ICD-10-CM | POA: Diagnosis present

## 2023-09-15 DIAGNOSIS — K219 Gastro-esophageal reflux disease without esophagitis: Secondary | ICD-10-CM | POA: Diagnosis not present

## 2023-09-15 DIAGNOSIS — E1136 Type 2 diabetes mellitus with diabetic cataract: Secondary | ICD-10-CM | POA: Diagnosis not present

## 2023-09-15 DIAGNOSIS — I4891 Unspecified atrial fibrillation: Secondary | ICD-10-CM | POA: Insufficient documentation

## 2023-09-15 LAB — CBC
HCT: 35.7 % — ABNORMAL LOW (ref 39.0–52.0)
Hemoglobin: 11.5 g/dL — ABNORMAL LOW (ref 13.0–17.0)
MCH: 29.6 pg (ref 26.0–34.0)
MCHC: 32.2 g/dL (ref 30.0–36.0)
MCV: 91.8 fL (ref 80.0–100.0)
Platelets: 134 10*3/uL — ABNORMAL LOW (ref 150–400)
RBC: 3.89 MIL/uL — ABNORMAL LOW (ref 4.22–5.81)
RDW: 16.7 % — ABNORMAL HIGH (ref 11.5–15.5)
WBC: 7.3 10*3/uL (ref 4.0–10.5)
nRBC: 0 % (ref 0.0–0.2)

## 2023-09-15 LAB — BASIC METABOLIC PANEL
Anion gap: 7 (ref 5–15)
BUN: 10 mg/dL (ref 8–23)
CO2: 23 mmol/L (ref 22–32)
Calcium: 8.9 mg/dL (ref 8.9–10.3)
Chloride: 105 mmol/L (ref 98–111)
Creatinine, Ser: 0.82 mg/dL (ref 0.61–1.24)
GFR, Estimated: >60 mL/min (ref >60)
Glucose, Bld: 249 mg/dL — ABNORMAL HIGH (ref 70–99)
Potassium: 3.5 mmol/L (ref 3.5–5.1)
Sodium: 135 mmol/L (ref 135–145)

## 2023-09-15 LAB — GLUCOSE, CAPILLARY: Glucose-Capillary: 239 mg/dL — ABNORMAL HIGH (ref 70–99)

## 2023-09-15 LAB — HEMOGLOBIN A1C
Hgb A1c MFr Bld: 5.5 % (ref 4.8–5.6)
Mean Plasma Glucose: 111.15 mg/dL

## 2023-09-15 NOTE — Progress Notes (Signed)
PCP - Vernelle Emerald, DO Cardiologist - Dr. Gypsy Balsam - Last office visit 08/30/23   PPM/ICD - Denies Device Orders - n/a Rep Notified - n/a   Chest x-ray - n/a EKG - 04/24/2023 Stress Test - 07/05/2023 ECHO - 07/14/2023 Cardiac Cath - Denies   Sleep Study - +OSA. Pt wears CPAP nightly with 0.3L bled into machine. He does not know the pressure settings.   Pt is DM2. He recently got a new meter with new supplies. Pt does not check blood sugars regularly, but when he was checking it regularly, normal fasting range was around 125. CBG at pre-op 239. Pt last ate at 1000 and had french toast sticks and black coffee. A1c result pending. Pt also instructed to start checking his blood sugars regularly to decrease any chance of a high sugar cancelling his surgery. Pt and wife understood instructions   Last dose of GLP1 agonist- n/a GLP1 instructions: n/a   Blood Thinner Instructions: Pt will complete Lovenox Bridge. His last dose of Warfarin will be 12/21 and he will start his Lovenox 12/23. Aspirin Instructions: n/a   ERAS Protcol - Clear liquids until 0700 morning of surgery PRE-SURGERY Ensure or G2- n/a   COVID TEST- n/a     Anesthesia review: Yes. 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 Pt surgery was cancelled and rescheduled once stable from cardiac standpoint.   Patient denies shortness of breath, fever, cough and chest pain at PAT appointment. Pt denies any respiratory illness/infection in the last two months.      All instructions explained to the patient and pts wife, with a verbal understanding of the material. Patient agrees to go over the instructions  while at home for a better understanding. Patient also instructed to self quarantine after being tested for COVID-19. The opportunity to ask questions was provided.

## 2023-09-15 NOTE — Telephone Encounter (Signed)
   [facility] Pre-operative Risk Assessment    Request for surgical clearance:  What type of surgery is being performed? Right Parotidectomy    When is this surgery scheduled? 09/22/2023   What type of clearance is required (medical clearance vs. Pharmacy clearance to hold med vs. Both)? Both  Are there any medications that need to be held prior to surgery and how long?Not specified   Practice name and name of physician performing surgery? Dr. Jenne Pane at [facility]    What is your office phone number: [phone]    7.   What is your office fax number: [phone]  8.   Anesthesia type (None, local, MAC, general) ? Not specified   Tiburcio Pea Kyriakos Babler 09/15/2023, 4:59 PM  _________________________________________________________________   (provider comments below)

## 2023-09-16 NOTE — Telephone Encounter (Addendum)
   Patient Name: KAYEDEN VANDERSLUIS  DOB: 11-08-1945 MRN: [id number]  Primary Cardiologist: Gypsy Balsam, MD  Chart reviewed as part of pre-operative protocol coverage. Given past medical history and time since last visit, based on ACC/AHA guidelines, AGASTYA LUPE is at acceptable risk for the planned procedure without further cardiovascular testing.   Please see office note from Dr. Bing Matter regarding Coumadin and bridging for upcoming procedure.  The patient was advised that if he develops new symptoms prior to surgery to contact our office to arrange for a follow-up visit, and he verbalized understanding.  I will route this recommendation to the requesting party via Epic fax function and remove from pre-op pool.  Please call with questions.  Napoleon Form, Leodis Rains, NP 09/16/2023, 8:35 AM

## 2023-09-18 ENCOUNTER — Encounter (HOSPITAL_COMMUNITY): Payer: Self-pay

## 2023-09-18 NOTE — Progress Notes (Signed)
Anesthesia Chart Review:  Case: [id number] Date/Time: 09/22/23 0945   Procedure: RIGHT PAROTIDECTOMY (Right)   Anesthesia type: General   Pre-op diagnosis: Parotid mass   Location: [facility] ROOM 09 / [facility]   Surgeons: Christia Reading, MD       DISCUSSION: Patient is a 77 year old male scheduled for the above procedure. He has known right parotid gland mass that was under observation since 2021, but but more recently has had swelling and tenderness. 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  He had been followed by the [facility] cardiology for mechanical AVR, MVR and afib, and warfarin therapy managed at the [facility]. Last [facility] cardiology note seen is from 02/03/23 by Anne Hahn, PA-C for follow-up and preoperative evaluation prior to what sounds like kyphoplasty for T11 & T12 compression fractures.  He had a CVA 01/06/23 (in setting of fluctuating INR) with hospitalization at [facility], so waiting 3 months post-CVA had been advised. More recently, he has been followed by cardiologist Gypsy Balsam, MD with [facility] in [place]. He now has INR monitoring there as well for better consistency.   He had evaluation with Dr. Mauri Brooklyn on 07/10/23 for follow-up and preoperative evaluation for ENT surgery. Surgery had been delayed until he was six weeks post-CVA. A preoperative TEE with possible DCCV was discussed to see if he could be convert from afib to SR (since LA only mildly enlarged on TTE). This was done on 07/14/23, but LAA  thrombus was noted, so DCCV was not performed. Anticoagulation INR target was intensified to 3-3.5 range. Parotid surgery was also postponed.   He most recent follow-up with Dr. Bing Matter was on 08/30/23 (note is currently pended). He wrote, "Persistent atrial fibrillation, did look at his transesophageal echocardiogram look like it was quite significant enlargement moderate with reaching the point that chances of keeping him normal rhythm are obviously without any extreme pressures. In terms of his surgery should be fine to proceed with this from cardiac standpoint review he need to be anticoagulated he is on Coumadin right now he need to be either brought to hospital put on heparin IV for few days waiting ending INR drop below 1.8 then proceed with surgery and then restart Coumadin as quickly visible with heparin as well. Alternative could be Lovenox. In terms of bring him back to normal rhythm I have more more doubt that this is something that will last most likely will refer him to our EP team for evaluation." INR with Lovenox bridge instructions are outlined on 09/12/23 (see Patient Instructions by Betsy Coder, RN). RN confirmed he is aware of instructions. He is for PT/PTT on the day of surgery.   Anesthesia team to evaluate on the day of surgery.   VS: BP (!) 159/86   Pulse 81   Temp 36.8 C   Resp 17   Ht 5\' 8"  (1.727 m)   Wt 97.1 kg   SpO2 98%   BMI 32.54 kg/m    PROVIDERS: Crisoforo Oxford, DO is PCP  Gypsy Balsam, MD is cardiologist   LABS: Preoperative labs noted. A1c  5.5%. PLT 134K, up fro 95K on 08/01/23. H/H stable at 11.5/35.7.  (all labs ordered are listed, but only abnormal results are displayed)  Labs Reviewed  GLUCOSE, CAPILLARY - Abnormal; Notable for the following components:      Result Value   Glucose-Capillary 239 (*)    All other components within normal limits  BASIC METABOLIC PANEL - Abnormal; Notable for the following components:   Glucose, Bld 249  (*)    All other components within normal limits  CBC - Abnormal; Notable for the following components:   RBC 3.89 (*)    Hemoglobin 11.5 (*)    HCT 35.7 (*)    RDW 16.7 (*)    Platelets 134 (*)    All other components within normal limits  HEMOGLOBIN A1C     IMAGES: MRI T/L-spine 01/10/23 [facility] [place]/PACS): IMPRESSION: 1. Acute compression fractures of the T11 and T12 vertebral bodies, with mild height loss at T12 but no significant retropulsion. 2. No acute fracture in the lumbar spine. 3. Mild thoracic and lumbar spondylosis, as detailed above. No high-grade spinal canal stenosis or neural foraminal narrowing.  MRI Brain 01/07/23 [facility] [place]/PACS): IMPRESSION: 1. Multiple small acute infarcts in the left posterior frontal and parietal lobes. Slight edema without mass effect. 2. Suspected additional tiny acute or early subacute infarct in the left occipital lobe and possibly the superior left thalamus. Given potential involvement of multiple vascular territories and the patient's clinical history, consider an embolic etiology.  CTA Head/Neck 01/06/23 [facility] [place]/PACS): IMPRESSION: 1. No emergent large vessel occlusion. 2. Moderate stenosis of the mid left P2 segment. 3. Atherosclerotic changes at the carotid bifurcations and cavernous internal carotid arteries bilaterally without significant stenosis relative to the more distal vessels. 4. Multiple hyperdense nodules just posterior and inferior to the right parotid gland likely represent large nodes. The largest measures 3.3 x 2.0 x 2.7 cm. These raise concern for metastatic disease within unknown primary. Primary parotid lesion is considered less likely. 5. Mild fullness at the glossal tonsillar sulcus bilaterally. No definite mucosal or submucosal lesions are present. Recommend direct visualization. 6. Aortic Atherosclerosis (ICD10-I70.0) and Emphysema (ICD10-J43.9).  1V PCXR  01/06/23 [facility], [place]/PACS): FINDINGS: Normal lung volumes. No focal consolidations. No pleural effusion or pneumothorax. Similar cardiomediastinal silhouette. Median sternotomy wires are nondisplaced. IMPRESSION: No active disease.    EKG: 07/21/23: Atrial fibrillation at 92 bpm  Abnormal ECG When compared with ECG of 10-Jul-2023 14:17, No significant change was found   CV: TEE 07/14/23: IMPRESSIONS   1. LAA thrombus present in the distal tip of the appendage. Best seen  image 23 and contrast image 69. Confirmed on contrast imaging. The  thrombus has serpiginous stalk extending into the mid appendage.  Cardioversion not performed. Left atrial size was  severely dilated. A left atrial/left atrial appendage thrombus was  detected. The LAA emptying velocity was 14 cm/s.   2. Left ventricular ejection fraction, by estimation, is 45 to 50%. The  left ventricle has mildly decreased function. The left ventricle  demonstrates global hypokinesis.   3. Right ventricular systolic function is mildly reduced. The right  ventricular size is normal.   4. Right atrial size was mildly dilated.   5. Normal mechanical MV prosthesis. MG 4 mmHG @ 77 bpm. Emax 1.6 m/s, PHT  96 msec, EOA 2.95 cm2. Washing jets noted which are normal. The mitral  valve has been repaired/replaced. No evidence of mitral valve  regurgitation. There is a 25 mm mechanical  valve present in the mitral position. Procedure Date: 2018. Echo findings  are consistent with normal structure and function of the mitral valve  prosthesis.   6. 23 mechanical AoV. Vmax 1.7 m/s, MG 5.0 mmHG, EOA 2.88 cm2, DI 0.50.  Normal prosthesis. The aortic valve has been repaired/replaced. Aortic  valve regurgitation is not visualized. There is a 23 mm St. Jude valve  present in the aortic position.  Procedure Date: 2018. Echo findings are consistent with normal structure  and function of the aortic valve prosthesis.   7. There is  mild (Grade II) layered plaque involving the descending  aorta.    TTE 07/05/23: IMPRESSIONS   1. Rhythm strip during this exam demonstrates atrial fibrillation.   2. Technically difficult study.   3. Left ventricular ejection fraction, by estimation, is 50 to 55%. The  left ventricle has low normal function. Left ventricular endocardial  border not optimally defined to evaluate regional wall motion. Left  ventricular diastolic parameters are  indeterminate.   4. Right ventricular systolic function is normal. The right ventricular  size is not well visualized.   5. Left atrial size was mildly dilated.   6. The mitral valve was not well visualized. No evidence of mitral valve  regurgitation. The mean mitral valve gradient is 6.0 mmHg with average  heart rate of 76 bpm. There is a 25 mm present in the mitral position.  Procedure Date: 2019.   7. The aortic valve was not well visualized. Aortic valve regurgitation  is not visualized. There is a 23 mm St. Jude valve present in the aortic  position. Procedure Date: 2018. Aortic valve mean gradient measures 6.8  mmHg. Aortic valve Vmax measures  1.80 m/s.    Nuclear stress test 07/05/23:   No evidence of ischemia on this study.   The left ventricular ejection fraction is mildly decreased (45-54%). End diastolic cavity size is normal.   Prior study not available for comparison.    RHC/LHC 07/2016 (Per [facility] cardiology note from Anne Hahn, PA-C on 02/03/23; scanned under Media tab, Date 03/06/23): Cardiac Cath in 07/2016- CORONARY ANGIOGRAPHY -------------- Native Vessels -------------- Summary: Nonobstructive CAD Dominance: Right dominant Stenoses Details --------------------------------------------------------------- Segment Stenosis Length Characteristics and Comments --------------------------------------------------------------   Left Main Short LAD (overall)  Proximal LAD 20%  1st Diagonal small 2nd Diagonal  small CIRCUMFLEX (overall) Normal RCA (overall) Luminal irregularities Proximal RCA 10%   ------------------------------------------------------------- note: Stenosis = highest % stenosis within segment  RIGHT HEART CATHETERIZATION  Pressures (mmHg) RA mean: 8 RV: 60/ 9 PA: 67/ 32, mean 40 PCWP: 31 Mean BP: 93  Heart Rate: 69 bpm Hemoglobin: 12  Cardiac Output (Q): Q(Fick): 4.5 L/min  CI(Fick) : 2.06 L/min/m^2  SV(Fick) : 65 ml Vascular Resistance: Pulmonary vascular resistance: 2 Wood units Systemic vascular resistance: 1521 dynes-sec-cm^-5  Oxygen Saturations: Arterial saturation: 93% Mixed venous saturation: 55%  AORTIC VALVE STUDY (Dual Lumen simultaneous pressures) Mean gradient: 26 mmHg Valve area: 0.79 cm^2 (Fick cardiac output) Valve stenosis: Severe  MITRAL VALVE STUDY Mean gradient: 16 mmHg Valve area: 1.03 cm^2 (Fick cardiac output) Valve stenosis: Severe   Past Medical History:  Diagnosis Date   Anemia, unspecified 03/06/2023   Aortic valve replaced 03/14/2023   Arthritis    Back   Atrial fibrillation (HCC) 03/06/2023   Cataract    B/L early cataracts   Cerebral infarction [facility]) 03/06/2023   Feb 01, 2023 Entered By: Crisoforo Oxford Comment: RUE weakness + facial droop-[facility],see jlv.  CHF (congestive heart failure) (HCC)    Cholesterol retinal embolus of 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   Cigarette smoker 07/14/2017   Compression fracture of vertebral column (HCC) 05/31/2023   Diabetes mellitus without complication (HCC)    Dysrhythmia    A. Fib   Encounter for fitting and adjustment of hearing aid 03/06/2023   Esophageal reflux 03/06/2023   Essential hypertension 03/06/2023   GERD (gastroesophageal reflux disease)    PMH   H/O mitral valve replacement 25 mm changes done at [facility] 2018 03/06/2023   Heart murmur    Mitral and Aortic Valve replaced   Hypercholesterolemia    Hypertension    Late effect of cerebrovascular accident (CVA)  03/06/2023   Long term (current) use of anticoagulants 03/06/2023   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/08/2014   Mitral valve stenosis 03/06/2023   Jun 24, 2020 Entered By: Sheryn Bison Comment: s/p mech mitral valve in 2017 @ [facility]     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 mellitus (HCC) 03/06/2023   Nicotine dependence 03/06/2023   Obesity 03/06/2023   Obstructive sleep apnea (adult) (pediatric) 03/06/2023   Primary open-angle glaucoma, bilateral, 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   Retinal artery branch occlusion, 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   Retinal ischemia 03/06/2023   Seasonal allergies    Sleep apnea    Solitary pulmonary nodule 07/13/2017   CT at [facility] ins [place] req 07/13/2017   - Spirometry 07/13/2017  FEV1 1.57 (50%)  Ratio 81      Special screening for malignant neoplasms, colon 03/06/2023   Oct 08, 2013 Entered By: Crisoforo Oxford Comment: Normal colonoscopy 12-19-12-see gi consult     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, 2018 and 03/06/2023   Stroke (HCC) 01/06/2023   weakness right arm   Tobacco use disorder 03/06/2023   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 without macular edema, unspecified eye (HCC) 03/06/2023   Wears glasses     Past Surgical History:  Procedure Laterality Date   BACK SURGERY  1984   Lumbar Spine   CARDIAC VALVE REPLACEMENT  2018   Aortic and Mitral Valve Replacement   COLONOSCOPY  2013   INGUINAL HERNIA REPAIR Left    x2   PAROTIDECTOMY Left 08/08/2014   Procedure: PAROTIDECTOMY;  Surgeon: Suzanna Obey, MD;  Location: [facility] OR;  Service: ENT;  Laterality: Left;   TRANSESOPHAGEAL ECHOCARDIOGRAM (CATH LAB) N/A 07/14/2023   Procedure: TRANSESOPHAGEAL ECHOCARDIOGRAM;  Surgeon: Sande Rives, MD;  Location: [facility] INVASIVE CV LAB;  Service: Cardiovascular;  Laterality: N/A;    MEDICATIONS:  acetaminophen (TYLENOL) 500 MG tablet   atorvastatin (LIPITOR) 40 MG tablet   Cyanocobalamin (B-12)  2500 MCG TABS   enoxaparin (LOVENOX) 100 MG/ML injection   latanoprost (XALATAN) 0.005 % ophthalmic solution   metoprolol tartrate (LOPRESSOR) 50 MG tablet   pantoprazole (PROTONIX) 40 MG tablet   potassium chloride (KLOR-CON) 10 MEQ tablet   sacubitril-valsartan (ENTRESTO) 49-51 MG   sitaGLIPtin (JANUVIA) 25 MG tablet   warfarin (COUMADIN) 5 MG tablet   No current facility-administered medications for this encounter.    Shonna Chock, PA-C Surgical Short Stay/Anesthesiology [facility] Phone [phone] [facility] Phone [phone] 09/18/2023 11:17 AM

## 2023-09-18 NOTE — Anesthesia Preprocedure Evaluation (Addendum)
Anesthesia Evaluation  Patient identified by MRN, date of birth, ID band Patient awake    Reviewed: Allergy & Precautions, H&P , NPO status , Patient's Chart, lab work & pertinent test results  Airway Mallampati: II  TM Distance: >3 FB Neck ROM: Full    Dental no notable dental hx. (+) Edentulous Upper, Partial Lower, Dental Advisory Given   Pulmonary sleep apnea , former smoker   Pulmonary exam normal breath sounds clear to auscultation       Cardiovascular Exercise Tolerance: Good hypertension, Pt. on medications and Pt. on home beta blockers +CHF  + dysrhythmias Atrial Fibrillation  Rhythm:Regular Rate:Normal     Neuro/Psych CVA, Residual Symptoms  negative psych ROS   GI/Hepatic Neg liver ROS,GERD  Medicated,,  Endo/Other  diabetes, Type 2, Oral Hypoglycemic Agents    Renal/GU negative Renal ROS  negative genitourinary   Musculoskeletal  (+) Arthritis , Osteoarthritis,    Abdominal   Peds  Hematology  (+) Blood dyscrasia, anemia   Anesthesia Other Findings   Reproductive/Obstetrics negative OB ROS                             Anesthesia Physical Anesthesia Plan  ASA: 3  Anesthesia Plan: General   Post-op Pain Management: Tylenol PO (pre-op)*   Induction: Intravenous  PONV Risk Score and Plan: 3 and Ondansetron, Dexamethasone and Treatment may vary due to age or medical condition  Airway Management Planned: Oral ETT  Additional Equipment:   Intra-op Plan:   Post-operative Plan: Extubation in OR  Informed Consent: I have reviewed the patients History and Physical, chart, labs and discussed the procedure including the risks, benefits and alternatives for the proposed anesthesia with the patient or authorized representative who has indicated his/her understanding and acceptance.     Dental advisory given  Plan Discussed with: CRNA  Anesthesia Plan Comments: (PAT note  written 09/18/2023 by Shonna Chock, PA-C.  )       Anesthesia Quick Evaluation

## 2023-09-21 NOTE — Progress Notes (Signed)
Left a message to pt's voicemail to arrive tom at 0530. NPO post mn and to stop clear liquids at 0430.

## 2023-09-22 ENCOUNTER — Encounter (HOSPITAL_COMMUNITY): Payer: Self-pay | Admitting: Otolaryngology

## 2023-09-22 ENCOUNTER — Other Ambulatory Visit: Payer: Self-pay

## 2023-09-22 ENCOUNTER — Encounter (HOSPITAL_COMMUNITY)
Admission: RE | Disposition: A | Discharge status: Home / Self Care | Payer: Self-pay | Source: Home / Self Care | Attending: Otolaryngology

## 2023-09-22 ENCOUNTER — Observation Stay (HOSPITAL_COMMUNITY)
Admission: RE | Admit: 2023-09-22 | Discharge: 2023-09-23 | Disposition: A | Discharge status: Home / Self Care | Payer: Medicare PPO | Attending: Otolaryngology | Admitting: Otolaryngology

## 2023-09-22 ENCOUNTER — Ambulatory Visit (HOSPITAL_BASED_OUTPATIENT_CLINIC_OR_DEPARTMENT_OTHER): Payer: Medicare PPO | Admitting: Anesthesiology

## 2023-09-22 ENCOUNTER — Ambulatory Visit (HOSPITAL_COMMUNITY): Payer: Medicare PPO | Admitting: Vascular Surgery

## 2023-09-22 DIAGNOSIS — Z79899 Other long term (current) drug therapy: Secondary | ICD-10-CM | POA: Diagnosis not present

## 2023-09-22 DIAGNOSIS — E119 Type 2 diabetes mellitus without complications: Secondary | ICD-10-CM | POA: Insufficient documentation

## 2023-09-22 DIAGNOSIS — I509 Heart failure, unspecified: Secondary | ICD-10-CM | POA: Diagnosis not present

## 2023-09-22 DIAGNOSIS — C07 Malignant neoplasm of parotid gland: Principal | ICD-10-CM | POA: Insufficient documentation

## 2023-09-22 DIAGNOSIS — K118 Other diseases of salivary glands: Secondary | ICD-10-CM

## 2023-09-22 DIAGNOSIS — I110 Hypertensive heart disease with heart failure: Secondary | ICD-10-CM | POA: Diagnosis not present

## 2023-09-22 DIAGNOSIS — I4891 Unspecified atrial fibrillation: Secondary | ICD-10-CM

## 2023-09-22 DIAGNOSIS — Z87891 Personal history of nicotine dependence: Secondary | ICD-10-CM | POA: Diagnosis not present

## 2023-09-22 DIAGNOSIS — Z7901 Long term (current) use of anticoagulants: Secondary | ICD-10-CM | POA: Insufficient documentation

## 2023-09-22 HISTORY — PX: PAROTIDECTOMY: SHX2163

## 2023-09-22 HISTORY — DX: Other diseases of salivary glands: K11.8

## 2023-09-22 LAB — GLUCOSE, CAPILLARY
Glucose-Capillary: 176 mg/dL — ABNORMAL HIGH (ref 70–99)
Glucose-Capillary: 172 mg/dL — ABNORMAL HIGH (ref 70–99)
Glucose-Capillary: 203 mg/dL — ABNORMAL HIGH (ref 70–99)
Glucose-Capillary: 226 mg/dL — ABNORMAL HIGH (ref 70–99)
Glucose-Capillary: 295 mg/dL — ABNORMAL HIGH (ref 70–99)

## 2023-09-22 LAB — PROTIME-INR
INR: 1.3 — ABNORMAL HIGH (ref 0.8–1.2)
Prothrombin Time: 16.4 s — ABNORMAL HIGH (ref 11.4–15.2)

## 2023-09-22 SURGERY — EXCISION, PAROTID GLAND
Anesthesia: General | Site: Neck | Laterality: Right

## 2023-09-22 MED ORDER — ORAL CARE MOUTH RINSE: 15.0000 mL | Freq: Once | OROMUCOSAL | Status: AC

## 2023-09-22 MED ORDER — HYDROCODONE-ACETAMINOPHEN 5-325 MG PO TABS
1.0000 | ORAL_TABLET | ORAL | Status: DC | PRN
  Administered 2023-09-22: 1 via ORAL
  Filled 2023-09-22: qty 1

## 2023-09-22 MED ORDER — BACITRACIN ZINC 500 UNIT/GM EX OINT
TOPICAL_OINTMENT | CUTANEOUS | Status: DC | PRN
  Administered 2023-09-22: 1 via TOPICAL

## 2023-09-22 MED ORDER — DEXMEDETOMIDINE HCL IN NACL 80 MCG/20ML IV SOLN
INTRAVENOUS | Status: DC | PRN
  Administered 2023-09-22: 4 ug via INTRAVENOUS
  Administered 2023-09-22: 8 ug via INTRAVENOUS

## 2023-09-22 MED ORDER — PHENYLEPHRINE 80 MCG/ML (10ML) SYRINGE FOR IV PUSH (FOR BLOOD PRESSURE SUPPORT)
PREFILLED_SYRINGE | INTRAVENOUS | Status: AC
  Filled 2023-09-22: qty 10

## 2023-09-22 MED ORDER — POTASSIUM CHLORIDE IN NACL 20-0.45 MEQ/L-% IV SOLN
INTRAVENOUS | Status: DC
  Filled 2023-09-22: qty 1000

## 2023-09-22 MED ORDER — ACETAMINOPHEN 500 MG PO TABS
1000.0000 mg | ORAL_TABLET | Freq: Once | ORAL | Status: AC
  Administered 2023-09-22: 1000 mg via ORAL
  Filled 2023-09-22: qty 2

## 2023-09-22 MED ORDER — ONDANSETRON HCL 4 MG/2ML IJ SOLN: 4.0000 mg | INTRAMUSCULAR | Status: DC | PRN

## 2023-09-22 MED ORDER — PROPOFOL 10 MG/ML IV BOLUS
INTRAVENOUS | Status: AC
  Filled 2023-09-22: qty 20

## 2023-09-22 MED ORDER — CEFAZOLIN SODIUM-DEXTROSE 1-4 GM/50ML-% IV SOLN
1.0000 g | Freq: Three times a day (TID) | INTRAVENOUS | Status: DC
  Administered 2023-09-22 – 2023-09-23 (×2): 1 g via INTRAVENOUS
  Filled 2023-09-22 (×3): qty 50

## 2023-09-22 MED ORDER — PANTOPRAZOLE SODIUM 40 MG PO TBEC
40.0000 mg | DELAYED_RELEASE_TABLET | Freq: Every day | ORAL | Status: DC
Start: 2023-09-23 — End: 2023-09-23
  Administered 2023-09-23: 40 mg via ORAL
  Filled 2023-09-22: qty 1

## 2023-09-22 MED ORDER — LACTATED RINGERS IV SOLN: INTRAVENOUS | Status: DC

## 2023-09-22 MED ORDER — SODIUM CHLORIDE 0.9 % IV SOLN: INTRAVENOUS | Status: DC | PRN

## 2023-09-22 MED ORDER — FENTANYL CITRATE (PF) 250 MCG/5ML IJ SOLN
INTRAMUSCULAR | Status: AC
  Filled 2023-09-22: qty 5

## 2023-09-22 MED ORDER — LIDOCAINE-EPINEPHRINE 1 %-1:100000 IJ SOLN
INTRAMUSCULAR | Status: DC | PRN
  Administered 2023-09-22: 4.5 mL

## 2023-09-22 MED ORDER — 0.9 % SODIUM CHLORIDE (POUR BTL) OPTIME
TOPICAL | Status: DC | PRN
  Administered 2023-09-22: 1000 mL

## 2023-09-22 MED ORDER — LIDOCAINE 2% (20 MG/ML) 5 ML SYRINGE
INTRAMUSCULAR | Status: DC | PRN
  Administered 2023-09-22: 60 mg via INTRAVENOUS

## 2023-09-22 MED ORDER — ENOXAPARIN SODIUM 100 MG/ML IJ SOSY
100.0000 mg | PREFILLED_SYRINGE | Freq: Two times a day (BID) | INTRAMUSCULAR | Status: DC
  Administered 2023-09-22: 100 mg via SUBCUTANEOUS
  Filled 2023-09-22 (×3): qty 1

## 2023-09-22 MED ORDER — CHLORHEXIDINE GLUCONATE 0.12 % MT SOLN
15.0000 mL | Freq: Once | OROMUCOSAL | Status: AC
  Administered 2023-09-22: 15 mL via OROMUCOSAL
  Filled 2023-09-22: qty 15

## 2023-09-22 MED ORDER — METOPROLOL TARTRATE 50 MG PO TABS
50.0000 mg | ORAL_TABLET | Freq: Two times a day (BID) | ORAL | Status: DC
  Administered 2023-09-22 – 2023-09-23 (×2): 50 mg via ORAL
  Filled 2023-09-22 (×2): qty 1

## 2023-09-22 MED ORDER — FENTANYL CITRATE (PF) 250 MCG/5ML IJ SOLN
INTRAMUSCULAR | Status: DC | PRN
  Administered 2023-09-22: 50 ug via INTRAVENOUS
  Administered 2023-09-22: 100 ug via INTRAVENOUS
  Administered 2023-09-22 (×2): 50 ug via INTRAVENOUS

## 2023-09-22 MED ORDER — ONDANSETRON HCL 4 MG PO TABS: 4.0000 mg | ORAL_TABLET | ORAL | Status: DC | PRN

## 2023-09-22 MED ORDER — LATANOPROST 0.005 % OP SOLN
1.0000 [drp] | Freq: Every day | OPHTHALMIC | Status: DC
Start: 2023-09-22 — End: 2023-09-23
  Administered 2023-09-22: 1 [drp] via OPHTHALMIC
  Filled 2023-09-22: qty 2.5

## 2023-09-22 MED ORDER — PHENYLEPHRINE 80 MCG/ML (10ML) SYRINGE FOR IV PUSH (FOR BLOOD PRESSURE SUPPORT)
PREFILLED_SYRINGE | INTRAVENOUS | Status: DC | PRN
  Administered 2023-09-22: 160 ug via INTRAVENOUS
  Administered 2023-09-22: 240 ug via INTRAVENOUS
  Administered 2023-09-22: 160 ug via INTRAVENOUS
  Administered 2023-09-22: 80 ug via INTRAVENOUS
  Administered 2023-09-22 (×2): 160 ug via INTRAVENOUS
  Administered 2023-09-22: 80 ug via INTRAVENOUS
  Administered 2023-09-22: 240 ug via INTRAVENOUS
  Administered 2023-09-22: 80 ug via INTRAVENOUS

## 2023-09-22 MED ORDER — DEXAMETHASONE SODIUM PHOSPHATE 10 MG/ML IJ SOLN
INTRAMUSCULAR | Status: DC | PRN
  Administered 2023-09-22: 10 mg via INTRAVENOUS

## 2023-09-22 MED ORDER — POTASSIUM CHLORIDE CRYS ER 10 MEQ PO TBCR
10.0000 meq | EXTENDED_RELEASE_TABLET | Freq: Every day | ORAL | Status: DC
  Administered 2023-09-23: 10 meq via ORAL
  Filled 2023-09-22: qty 1

## 2023-09-22 MED ORDER — ATORVASTATIN CALCIUM 40 MG PO TABS
40.0000 mg | ORAL_TABLET | Freq: Every day | ORAL | Status: DC
Start: 2023-09-23 — End: 2023-09-23
  Filled 2023-09-22: qty 1

## 2023-09-22 MED ORDER — LINAGLIPTIN 5 MG PO TABS
5.0000 mg | ORAL_TABLET | Freq: Every day | ORAL | Status: DC
  Administered 2023-09-23: 5 mg via ORAL
  Filled 2023-09-22: qty 1

## 2023-09-22 MED ORDER — ONDANSETRON HCL 4 MG/2ML IJ SOLN
INTRAMUSCULAR | Status: DC | PRN
  Administered 2023-09-22: 4 mg via INTRAVENOUS

## 2023-09-22 MED ORDER — CEFAZOLIN SODIUM-DEXTROSE 2-4 GM/100ML-% IV SOLN
2.0000 g | INTRAVENOUS | Status: AC
  Administered 2023-09-22: 2 g via INTRAVENOUS
  Filled 2023-09-22: qty 100

## 2023-09-22 MED ORDER — MORPHINE SULFATE (PF) 2 MG/ML IV SOLN: 2.0000 mg | INTRAVENOUS | Status: DC | PRN

## 2023-09-22 MED ORDER — SUCCINYLCHOLINE CHLORIDE 200 MG/10ML IV SOSY
PREFILLED_SYRINGE | INTRAVENOUS | Status: DC | PRN
  Administered 2023-09-22: 120 mg via INTRAVENOUS

## 2023-09-22 MED ORDER — PHENYLEPHRINE HCL-NACL 20-0.9 MG/250ML-% IV SOLN
INTRAVENOUS | Status: DC | PRN
  Administered 2023-09-22: 40 ug/min via INTRAVENOUS

## 2023-09-22 MED ORDER — HYDROMORPHONE HCL 1 MG/ML IJ SOLN: 0.2500 mg | INTRAMUSCULAR | Status: DC | PRN

## 2023-09-22 MED ORDER — BACITRACIN ZINC 500 UNIT/GM EX OINT
TOPICAL_OINTMENT | CUTANEOUS | Status: AC
  Filled 2023-09-22: qty 28.35

## 2023-09-22 MED ORDER — VITAMIN B-12 1000 MCG PO TABS
2500.0000 ug | ORAL_TABLET | Freq: Every day | ORAL | Status: DC
  Administered 2023-09-23: 2500 ug via ORAL
  Filled 2023-09-22: qty 3

## 2023-09-22 MED ORDER — INSULIN ASPART 100 UNIT/ML IJ SOLN: 0.0000 [IU] | INTRAMUSCULAR | Status: DC | PRN

## 2023-09-22 MED ORDER — PROPOFOL 10 MG/ML IV BOLUS
INTRAVENOUS | Status: DC | PRN
  Administered 2023-09-22: 30 mg via INTRAVENOUS
  Administered 2023-09-22: 100 mg via INTRAVENOUS
  Administered 2023-09-22: 50 mg via INTRAVENOUS

## 2023-09-22 MED ORDER — BACITRACIN ZINC 500 UNIT/GM EX OINT
1.0000 | TOPICAL_OINTMENT | Freq: Three times a day (TID) | CUTANEOUS | Status: DC
  Administered 2023-09-22 – 2023-09-23 (×2): 1 via TOPICAL
  Filled 2023-09-22: qty 28.35

## 2023-09-22 MED ORDER — DEXMEDETOMIDINE HCL IN NACL 80 MCG/20ML IV SOLN
INTRAVENOUS | Status: AC
  Filled 2023-09-22: qty 20

## 2023-09-22 MED ORDER — LIDOCAINE-EPINEPHRINE 1 %-1:100000 IJ SOLN
INTRAMUSCULAR | Status: AC
  Filled 2023-09-22: qty 1

## 2023-09-22 MED ORDER — SACUBITRIL-VALSARTAN 49-51 MG PO TABS
1.0000 | ORAL_TABLET | Freq: Two times a day (BID) | ORAL | Status: DC
  Administered 2023-09-22 – 2023-09-23 (×2): 1 via ORAL
  Filled 2023-09-22 (×2): qty 1

## 2023-09-22 SURGICAL SUPPLY — 42 items
BAG COUNTER SPONGE SURGICOUNT (BAG) ×1 IMPLANT
BLADE CLIPPER SURG (BLADE) IMPLANT
BLADE SURG 15 STRL LF DISP TIS (BLADE) IMPLANT
CANISTER SUCT 3000ML PPV (MISCELLANEOUS) ×1 IMPLANT
CLEANER TIP ELECTROSURG 2X2 (MISCELLANEOUS) ×1 IMPLANT
CNTNR URN SCR LID CUP LEK RST (MISCELLANEOUS) ×2 IMPLANT
CORD BIPOLAR FORCEPS 12FT (ELECTRODE) ×1 IMPLANT
COVER SURGICAL LIGHT HANDLE (MISCELLANEOUS) ×1 IMPLANT
DRAIN JACKSON RD 7FR 3/32 (WOUND CARE) IMPLANT
DRAPE HALF SHEET 40X57 (DRAPES) IMPLANT
DRAPE INCISE 13X13 STRL (DRAPES) ×1 IMPLANT
ELECT COATED BLADE 2.86 ST (ELECTRODE) ×1 IMPLANT
ELECT PAIRED SUBDERMAL (MISCELLANEOUS) ×1
ELECT REM PT RETURN 9FT ADLT (ELECTROSURGICAL) ×1
ELECTRODE PAIRED SUBDERMAL (MISCELLANEOUS) ×1 IMPLANT
ELECTRODE REM PT RTRN 9FT ADLT (ELECTROSURGICAL) ×1 IMPLANT
EVACUATOR SILICONE 100CC (DRAIN) ×1 IMPLANT
FORCEPS BIPOLAR SPETZLER 8 1.0 (NEUROSURGERY SUPPLIES) ×1 IMPLANT
GAUZE 4X4 16PLY ~~LOC~~+RFID DBL (SPONGE) ×1 IMPLANT
GLOVE BIO SURGEON STRL SZ7.5 (GLOVE) ×1 IMPLANT
GOWN STRL REUS W/ TWL LRG LVL3 (GOWN DISPOSABLE) ×2 IMPLANT
KIT BASIN OR (CUSTOM PROCEDURE TRAY) ×1 IMPLANT
KIT TURNOVER KIT B (KITS) ×1 IMPLANT
NDL HYPO 25GX1X1/2 BEV (NEEDLE) ×1 IMPLANT
NEEDLE HYPO 25GX1X1/2 BEV (NEEDLE) ×1
NS IRRIG 1000ML POUR BTL (IV SOLUTION) ×2 IMPLANT
PAD ARMBOARD 7.5X6 YLW CONV (MISCELLANEOUS) ×2 IMPLANT
PENCIL SMOKE EVACUATOR (MISCELLANEOUS) ×1 IMPLANT
POSITIONER HEAD DONUT 9IN (MISCELLANEOUS) IMPLANT
PROBE NERVBE PRASS .33 (MISCELLANEOUS) ×1 IMPLANT
SHEARS HARMONIC 9CM CVD (BLADE) ×1 IMPLANT
SPECIMEN JAR MEDIUM (MISCELLANEOUS) ×1 IMPLANT
STAPLER VISISTAT 35W (STAPLE) ×1 IMPLANT
SUT ETHILON 2 0 FS 18 (SUTURE) ×1 IMPLANT
SUT NYLON ETHILON 5-0 P-3 1X18 (SUTURE) ×1 IMPLANT
SUT SILK 2 0 PERMA HAND 18 BK (SUTURE) ×1 IMPLANT
SUT SILK 2 0 SH CR/8 (SUTURE) ×1 IMPLANT
SUT SILK 3 0 REEL (SUTURE) ×1 IMPLANT
SUT VIC AB 3-0 SH 27X BRD (SUTURE) ×1 IMPLANT
SUT VIC AB 4-0 PS2 27 (SUTURE) IMPLANT
TRAY ENT MC OR (CUSTOM PROCEDURE TRAY) ×1 IMPLANT
VASCULAR TIE MINI RED 18IN STL (MISCELLANEOUS) ×1 IMPLANT

## 2023-09-22 NOTE — Brief Op Note (Signed)
09/22/2023  9:48 AM  PATIENT:  James Paul  77 y.o. male  PRE-OPERATIVE DIAGNOSIS:  Parotid mass  POST-OPERATIVE DIAGNOSIS:  Parotid mass  PROCEDURE:  Procedure(s): RIGHT PAROTIDECTOMY (Right)  SURGEON:  Surgeons and Role:    Christia Reading, MD - Primary  PHYSICIAN ASSISTANT: Clovis Cao  ASSISTANTS: none   ANESTHESIA:   general  EBL:  10 mL   BLOOD ADMINISTERED:none  DRAINS: (7 Fr) Jackson-Pratt drain(s) with closed bulb suction in the right neck    LOCAL MEDICATIONS USED:  LIDOCAINE   SPECIMEN:  Source of Specimen:  right parotid mass  DISPOSITION OF SPECIMEN:  PATHOLOGY  COUNTS:  YES  TOURNIQUET:  * No tourniquets in log *  DICTATION: .Note written in EPIC  PLAN OF CARE: Admit for overnight observation  PATIENT DISPOSITION:  PACU - hemodynamically stable.   Delay start of Pharmacological VTE agent (>24hrs) due to surgical blood loss or risk of bleeding: yes

## 2023-09-22 NOTE — Op Note (Signed)
Preop diagnosis: Right parotid neoplasm Postop diagnosis: same Procedure: Right superficial parotidectomy with dissection of facial nerve Surgeon: Jenne Pane Assist: Clovis Cao, PA Anesth: General and local with 1% lidocaine with 1:100,000 epinephrine Compl: None Findings: Right lower parotid with 5 cm round mass Description:  After discussing risks, benefits, and alternatives, the patient was brought to the operative suite and placed on the operative table in the supine position.  Anesthesia was induced and the patient was intubated by the anesthesia team without difficulty.  The patient was given intravenous antibiotics.  The right parotid incision was marked with a marking pen and injected with local anesthetic.  The nerve integrity monitor was placed in the face for facial nerve monitoring and turned on during the case.  The right face and neck were prepped and draped in sterile fashion.  The incision was made with a 15 blade scalpel and extended through the subcutaneous and platysma layers with Bovie electrocautery.  A pre-parotid flap was elevated anteriorly and the earlobe was freed.  Skin flaps were sutured back with stay sutures.  Dissection was then performed anterior to the tragus and parotid tissue was elevated off of the mastoid.  With further dissection to the stylomastoid foramen, the main trunk of the facial nerve was identified.  The nerve was dissected to the main division and then down the inferior branches, dividing parotid tissue using the harmonic scalpel and bipolar electrocautery.  The marginal mandibular nerve was traced in an antegrade fashion until the inferior gland was able to be freed and the nerve kept intact.  The nerve stimulator was used for assistance.  The inferior parotid gland with mass was then dissected from surrounding tissues and removed.  This was passed to nursing for pathology.  The surgical site was then irrigated copiously with saline.  A 7 French suction drain was  placed in the depth of the wound and secured at the skin with 2-0 Nylon with a standard drain stitch.  The flaps were released and laid back down.  The platysma/subcutaneous layer was closed with 3-0 Vicryl in a simple, running fashion.  The skin was closed with 5-0 Nylon in a simple, running fashion.  The drain was placed to bulb suction.  Bacitracin ointment was added to the incision.  Drapes were removed and the drain was secured to the shoulder with tape.  He was then returned to anesthesia for wake-up and was extubated and moved to the recovery room in stable condition.

## 2023-09-22 NOTE — Progress Notes (Deleted)
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  LDL goal less than 70 Normotensive blood pressure Hgb A1c goal less than 7 Follow up 6 months.   Subjective:  James Paul is a 77 year old male with cardiomyopathy, mitral and aortic valve replacement and a fib on warfarin, PVD, HTN, HLD and sleep apnea who follows up for stroke.  He is accompanied by his wife.  ***  UPDATE: Current medications:  warfarin, atorvastatin 40mg ,  metoprolol tartrate  ***  HISTORY: On 01/06/2023, he developed right sided weakness with right facial droop.  A couple of months prior, he had a couple of brief episodes of right arm weakness but nothing as severe as this.  He was admitted to [facility] CT head negative for acute stroke.  CTA head and neck revealed no LVO or hemodynamically significant stenosis.  INR was found to be greater than 10, so he was not a t-PA candidate.  Treated with IV vitamin K.  MRI of brain showed multiple small acute infarcts in the left posterior frontal and parietal lobes as well as tiny acute or early subacute infarct in the left occipital lobe and possibly the superior left thalamus.  2D echo on 01/07/2023 revealed EF 40-45% s/p aortic valve repair with no evidence of aortic stenosis.  LDL was 74 and A1c was 7.0%.  He has had a difficult time regulating his INR which ranges from subtherapeutic to supratherapeutic levels (1.6 to over 10).  He had PT/OT and has seen significant improvement in right upper extremity strength but still notes some residual weakness.  PAST MEDICAL HISTORY: Past Medical History:  Diagnosis Date   Anemia, unspecified 03/06/2023    Aortic valve replaced 03/14/2023   Arthritis    Back   Atrial fibrillation (HCC) 03/06/2023   Cataract    B/L early cataracts   Cerebral infarction [facility]) 03/06/2023   Feb 01, 2023 Entered By: Crisoforo Oxford Comment: RUE weakness + facial droop-[facility],see jlv.     CHF (congestive heart failure) (HCC)    Cholesterol retinal embolus of 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   Cigarette smoker 07/14/2017   Compression fracture of vertebral column (HCC) 05/31/2023   Diabetes mellitus without complication (HCC)    Dysrhythmia    A. 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   Mitral valve stenosis 03/06/2023   Jun 24, 2020 Entered By: Sheryn Bison Comment: s/p mech mitral valve in 2017 @ [facility]     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 mellitus (HCC) 03/06/2023   Nicotine dependence 03/06/2023   Obesity 03/06/2023   Obstructive sleep apnea (adult) (pediatric) 03/06/2023   Primary open-angle glaucoma, bilateral, 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  Retinal artery branch occlusion, 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   Retinal ischemia 03/06/2023   Seasonal allergies    Sleep apnea    Solitary pulmonary nodule 07/13/2017   CT at [facility] ins [place] req 07/13/2017   - Spirometry 07/13/2017  FEV1 1.57 (50%)  Ratio 81      Special screening for malignant neoplasms, colon 03/06/2023   Oct 08, 2013 Entered By: Crisoforo Oxford Comment: Normal colonoscopy 12-19-12-see gi consult     Status post aortic valve 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, 2018 and 03/06/2023   Stroke (HCC) 01/06/2023   weakness right arm   Tobacco use disorder 03/06/2023   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 without macular edema, unspecified eye (HCC) 03/06/2023   Wears glasses     MEDICATIONS: Current Facility-Administered Medications on File Prior to Visit  Medication Dose Route Frequency Provider Last Rate Last Admin   ceFAZolin (ANCEF) IVPB 2g/100 mL premix  2 g Intravenous On Call to OR Christia Reading, MD       insulin aspart (novoLOG) injection 0-7 Units  0-7 Units Subcutaneous Q2H PRN Gaynelle Adu, MD       lactated ringers infusion   Intravenous Continuous Val Eagle, MD       Current Outpatient Medications on File Prior to Visit  Medication Sig Dispense Refill   acetaminophen (TYLENOL) 500 MG tablet Take 1,500 mg by mouth every 8 (eight) hours as needed for moderate pain (pain score 4-6).     atorvastatin (LIPITOR) 40 MG tablet Take 40 mg by mouth daily.     Cyanocobalamin (B-12) 2500 MCG TABS Take 2,500 mcg by mouth daily.     enoxaparin (LOVENOX) 100 MG/ML injection Inject 1 mL (100 mg total) into the skin every 12 (twelve) hours. 20 mL 1   latanoprost (XALATAN) 0.005 % ophthalmic solution Place 1 drop into both eyes at bedtime.     metoprolol tartrate (LOPRESSOR) 50 MG tablet Take 50 mg by mouth 2 (two) times daily.     pantoprazole (PROTONIX) 40 MG tablet Take 40 mg by mouth daily.     potassium chloride (KLOR-CON) 10 MEQ tablet Take 1 tablet (10 mEq total) by mouth as directed. Take 4 tabs (40 meq) today (10/16) and then 1 tab a day starting tomorrow (10/17) (Patient taking differently: Take 20 mEq by mouth daily.) 90 tablet 3   sacubitril-valsartan (ENTRESTO) 49-51 MG Take 1 tablet by mouth 2 (two) times daily. 180 tablet 3   sitaGLIPtin (JANUVIA) 25 MG tablet Take 25 mg by mouth daily.     warfarin (COUMADIN) 5 MG tablet TAKE 1/2 TABLET TO 1 TABLET BY  MOUTH DAILY AS DIRECTED BY COUMADIN CLINIC (Patient taking differently: Take 5 mg by mouth daily. TAKE 1/2 TABLET TO 1 TABLET BY MOUTH DAILY AS DIRECTED BY COUMADIN CLINIC) 75 tablet 1    ALLERGIES: No Known Allergies  FAMILY HISTORY: Family History  Problem Relation Age of Onset   Congestive Heart Failure Mother    Aneurysm Father    Congestive Heart Failure Sister       Objective:  *** General: No acute distress.  Patient appears ***-groomed.   Head:  Normocephalic/atraumatic Eyes:  Fundi examined but not visualized Neck: supple, no paraspinal tenderness, full range of motion Heart:  Regular rate and rhythm Lungs:  Clear to auscultation bilaterally Back: No paraspinal tenderness Neurological Exam: alert and oriented.  Speech fluent and not dysarthric, language intact.  CN II-XII intact. Bulk and tone  normal, muscle strength 5/5 throughout.  Sensation to light touch intact.  Deep tendon reflexes 2+ throughout, toes downgoing.  Finger to nose testing intact.  Gait normal, Romberg negative.   Shon Millet, DO  CC: ***

## 2023-09-22 NOTE — Transfer of Care (Signed)
Immediate Anesthesia Transfer of Care Note  Patient: James Paul  Procedure(s) Performed: RIGHT PAROTIDECTOMY (Right: Neck)  Patient Location: PACU  Anesthesia Type:General  Level of Consciousness: drowsy and responds to stimulation  Airway & Oxygen Therapy: Patient Spontanous Breathing and Patient connected to face mask oxygen  Post-op Assessment: Report given to RN and Post -op Vital signs reviewed and stable  Post vital signs: Reviewed and stable  Last Vitals:  Vitals Value Taken Time  BP 135/92 09/22/23 0952  Temp 36.9 C 09/22/23 0952  Pulse 87 09/22/23 0953  Resp 15 09/22/23 0953  SpO2 95 % 09/22/23 0953  Vitals shown include unfiled device data.  Last Pain:  Vitals:   09/22/23 0952  PainSc: Asleep      Patients Stated Pain Goal: 2 (09/22/23 1610)  Complications: No notable events documented.

## 2023-09-22 NOTE — H&P (Signed)
James Paul is an 77 y.o. male.   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  Past Medical History:  Diagnosis Date   Anemia, unspecified 03/06/2023   Aortic valve replaced 03/14/2023   Arthritis    Back   Atrial fibrillation (HCC) 03/06/2023   Cataract    B/L early cataracts   Cerebral infarction [facility]) 03/06/2023   Feb 01, 2023 Entered By: Crisoforo Oxford Comment: RUE weakness + facial droop-[facility],see jlv.     CHF (congestive heart failure) (HCC)    Cholesterol retinal embolus of 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   Cigarette smoker 07/14/2017   Compression fracture of vertebral column (HCC) 05/31/2023   Diabetes mellitus without complication (HCC)    Dysrhythmia    A. 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   Mitral valve stenosis 03/06/2023   Jun 24, 2020 Entered By: Sheryn Bison Comment: s/p mech mitral valve in 2017 @ [facility]     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 mellitus (HCC) 03/06/2023   Nicotine dependence 03/06/2023   Obesity 03/06/2023   Obstructive sleep apnea (adult) (pediatric) 03/06/2023   Primary open-angle glaucoma, bilateral, 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   Retinal artery branch occlusion, 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   Retinal ischemia 03/06/2023   Seasonal allergies    Sleep apnea    Solitary pulmonary nodule 07/13/2017   CT at [facility] ins  [place] req 07/13/2017   - Spirometry 07/13/2017  FEV1 1.57 (50%)  Ratio 81      Special screening for malignant neoplasms, colon 03/06/2023   Oct 08, 2013 Entered By: Crisoforo Oxford Comment: Normal colonoscopy 12-19-12-see gi consult     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, 2018 and 03/06/2023   Stroke (HCC) 01/06/2023   weakness right arm   Tobacco use disorder 03/06/2023   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 without macular edema, unspecified eye (HCC) 03/06/2023   Wears glasses     Past Surgical History:  Procedure Laterality Date   BACK SURGERY  1984   Lumbar Spine   CARDIAC VALVE REPLACEMENT  2018   Aortic and Mitral Valve Replacement   COLONOSCOPY  2013   INGUINAL HERNIA REPAIR Left    x2   PAROTIDECTOMY Left 08/08/2014   Procedure: PAROTIDECTOMY;  Surgeon: Suzanna Obey, MD;  Location: [facility] OR;  Service: ENT;  Laterality: Left;   TRANSESOPHAGEAL ECHOCARDIOGRAM (CATH LAB) N/A 07/14/2023   Procedure: TRANSESOPHAGEAL ECHOCARDIOGRAM;  Surgeon: Sande Rives, MD;  Location: [facility] INVASIVE CV LAB;  Service: Cardiovascular;  Laterality: N/A;    Family History  Problem Relation Age of Onset   Congestive Heart Failure Mother    Aneurysm Father    Congestive Heart Failure Sister    Social History:  reports that he has quit smoking. His smoking use included cigarettes. He started smoking about  57 years ago. He has a 28.1 pack-year smoking history. He has never used smokeless tobacco. He reports that he does not currently use alcohol. He reports that he does not use drugs.  Allergies: No Known Allergies  Medications Prior to Admission  Medication Sig Dispense Refill   acetaminophen (TYLENOL) 500 MG tablet Take 1,500 mg by mouth every 8 (eight) hours as needed for moderate pain (pain score 4-6).     atorvastatin (LIPITOR) 40 MG tablet Take 40 mg by mouth daily.     Cyanocobalamin (B-12) 2500 MCG TABS  Take 2,500 mcg by mouth daily.     enoxaparin (LOVENOX) 100 MG/ML injection Inject 1 mL (100 mg total) into the skin every 12 (twelve) hours. 20 mL 1   latanoprost (XALATAN) 0.005 % ophthalmic solution Place 1 drop into both eyes at bedtime.     metoprolol tartrate (LOPRESSOR) 50 MG tablet Take 50 mg by mouth 2 (two) times daily.     pantoprazole (PROTONIX) 40 MG tablet Take 40 mg by mouth daily.     potassium chloride (KLOR-CON) 10 MEQ tablet Take 1 tablet (10 mEq total) by mouth as directed. Take 4 tabs (40 meq) today (10/16) and then 1 tab a day starting tomorrow (10/17) (Patient taking differently: Take 20 mEq by mouth daily.) 90 tablet 3   sacubitril-valsartan (ENTRESTO) 49-51 MG Take 1 tablet by mouth 2 (two) times daily. 180 tablet 3   sitaGLIPtin (JANUVIA) 25 MG tablet Take 25 mg by mouth daily.     warfarin (COUMADIN) 5 MG tablet TAKE 1/2 TABLET TO 1 TABLET BY MOUTH DAILY AS DIRECTED BY COUMADIN CLINIC (Patient taking differently: Take 5 mg by mouth daily. TAKE 1/2 TABLET TO 1 TABLET BY MOUTH DAILY AS DIRECTED BY COUMADIN CLINIC) 75 tablet 1    Results for orders placed or performed during the hospital encounter of 09/22/23 (from the past 48 hours)  Glucose, capillary     Status: Abnormal   Collection Time: 09/22/23  6:07 AM  Result Value Ref Range   Glucose-Capillary 176 (H) 70 - 99 mg/dL    Comment: Glucose reference range applies only to samples taken after fasting for at least 8 hours.  Protime-INR     Status: Abnormal   Collection Time: 09/22/23  6:21 AM  Result Value Ref Range   Prothrombin Time 16.4 (H) 11.4 - 15.2 seconds   INR 1.3 (H) 0.8 - 1.2    Comment: (NOTE) INR goal varies based on device and disease states. Performed at [facility], [address], [place], [place] [address]    No results found.  Review of Systems  All other systems reviewed and are negative.   Blood pressure (!) 172/88, pulse 95, temperature (!) 97.3 F (36.3 C), resp. rate 18,  height 5\' 8"  (1.727 m), weight 95.3 kg, SpO2 96%. Physical Exam Constitutional:      Appearance: Normal appearance.  HENT:     Head: Normocephalic and atraumatic.     Right Ear: External ear normal.     Left Ear: External ear normal.     Nose: Nose normal.     Mouth/Throat:     Mouth: Mucous membranes are moist.     Pharynx: Oropharynx is clear.  Eyes:     Extraocular Movements: Extraocular movements intact.     Conjunctiva/sclera: Conjunctivae normal.     Pupils: Pupils are equal, round, and reactive to light.  Neck:     Comments: Large right lower parotid mass. Cardiovascular:  Rate and Rhythm: Normal rate.  Pulmonary:     Effort: Pulmonary effort is normal.  Skin:    General: Skin is warm and dry.  Neurological:     General: No focal deficit present.     Mental Status: He is alert and oriented to person, place, and time.  Psychiatric:        Mood and Affect: Mood normal.        Behavior: Behavior normal.        Thought Content: Thought content normal.        Judgment: Judgment normal.      Assessment/Plan Right parotid neoplasm  To OR for right parotidectomy.  Christia Reading, MD 09/22/2023, 7:20 AM

## 2023-09-22 NOTE — Anesthesia Postprocedure Evaluation (Signed)
Anesthesia Post Note  Patient: James Paul  Procedure(s) Performed: RIGHT PAROTIDECTOMY (Right: Neck)     Patient location during evaluation: PACU Anesthesia Type: General Level of consciousness: awake and alert Pain management: pain level controlled Vital Signs Assessment: post-procedure vital signs reviewed and stable Respiratory status: spontaneous breathing, nonlabored ventilation, respiratory function stable and patient connected to nasal cannula oxygen Cardiovascular status: blood pressure returned to baseline and stable Postop Assessment: no apparent nausea or vomiting Anesthetic complications: no  No notable events documented.  Last Vitals:  Vitals:   09/22/23 1015 09/22/23 1030  BP: (!) 149/78 (!) 158/83  Pulse: 84 80  Resp: 14 20  Temp:    SpO2: 95% 96%    Last Pain:  Vitals:   09/22/23 1015  PainSc: Asleep                 Acen Craun,W. EDMOND

## 2023-09-22 NOTE — Progress Notes (Signed)
Asleep but easily awakened on rounds.  No complaints.  No pain.  Drain is functioning properly.  Incision looks excellent.  No swelling or hematoma.  Facial nerve function is normal.  Continue overnight care and anticipate drain removal and discharge in the morning.

## 2023-09-22 NOTE — Anesthesia Procedure Notes (Signed)
Procedure Name: Intubation Date/Time: 09/22/2023 7:40 AM  Performed by: Ammie Dalton, CRNAPre-anesthesia Checklist: Patient identified, Emergency Drugs available, Suction available and Patient being monitored Patient Re-evaluated:Patient Re-evaluated prior to induction Oxygen Delivery Method: Circle System Utilized Preoxygenation: Pre-oxygenation with 100% oxygen Induction Type: IV induction Ventilation: Mask ventilation without difficulty Laryngoscope Size: Mac and 4 Grade View: Grade I Tube type: Oral Number of attempts: 1 Airway Equipment and Method: Stylet Placement Confirmation: ETT inserted through vocal cords under direct vision, positive ETCO2 and breath sounds checked- equal and bilateral Secured at: 22 cm Tube secured with: Tape Dental Injury: Teeth and Oropharynx as per pre-operative assessment

## 2023-09-23 ENCOUNTER — Encounter (HOSPITAL_COMMUNITY): Payer: Self-pay | Admitting: Otolaryngology

## 2023-09-23 DIAGNOSIS — C07 Malignant neoplasm of parotid gland: Secondary | ICD-10-CM | POA: Diagnosis not present

## 2023-09-23 NOTE — Discharge Instructions (Signed)
Keep the dressing on the site until there is no more drainage.  Call if there is any swelling or tenderness.

## 2023-09-23 NOTE — Discharge Summary (Signed)
Physician Discharge Summary  Patient ID: James Paul MRN: [id number] DOB/AGE: 75-Aug-1947 75 y.o.  Admit date: 09/22/2023 Discharge date: 09/23/2023  Admission Diagnoses: Parotid mass  Discharge Diagnoses:  Principal Problem:   Parotid mass   Discharged Condition: good  Hospital Course: No complications  Consults: none  Significant Diagnostic Studies: none  Treatments: surgery: Right superficial parotidectomy with facial nerve dissection  Discharge Exam: Blood pressure (!) 151/89, pulse 93, temperature (!) 97.5 F (36.4 C), temperature source Oral, resp. rate 16, height 5\' 8"  (1.727 m), weight 95.3 kg, SpO2 96%. PHYSICAL EXAM: Awake and alert.  No complaints.  Minimal pain.  Some bloody drainage from the incision but the surgical bed is not swollen and there is no hematoma.  The drainage in the JP is serosanguineous.  The drain was removed.  Facial nerve is normal.  Disposition:      Follow-up Information     Christia Reading, MD. Schedule an appointment as soon as possible for a visit on 10/02/2023.   Specialty: Otolaryngology Contact information: [address] [place] [address] [phone]                 Signed: Serena Colonel 09/23/2023, 8:30 AM

## 2023-09-23 NOTE — Plan of Care (Signed)
  Problem: Education: Goal: Knowledge of General Education information will improve Description: Including pain rating scale, medication(s)/side effects and non-pharmacologic comfort measures Outcome: Progressing   Problem: Health Behavior/Discharge Planning: Goal: Ability to manage health-related needs will improve Outcome: Progressing   Problem: Clinical Measurements: Goal: Ability to maintain clinical measurements within normal limits will improve Outcome: Progressing Goal: Will remain free from infection Outcome: Progressing Goal: Diagnostic test results will improve Outcome: Progressing Goal: Respiratory complications will improve Outcome: Progressing Goal: Cardiovascular complication will be avoided Outcome: Progressing   Problem: Activity: Goal: Risk for activity intolerance will decrease Outcome: Progressing   Problem: Nutrition: Goal: Adequate nutrition will be maintained Outcome: Progressing   Problem: Coping: Goal: Level of anxiety will decrease Outcome: Progressing   Problem: Elimination: Goal: Will not experience complications related to bowel motility Outcome: Progressing Goal: Will not experience complications related to urinary retention Outcome: Progressing   Problem: Pain Management: Goal: General experience of comfort will improve Outcome: Progressing

## 2023-09-23 NOTE — Progress Notes (Signed)
Change the gauge and abd pad to foam dressing to apply the antibiotic cream site is still bleeding

## 2023-09-23 NOTE — Progress Notes (Signed)
Megan Salon to be D/C'd  per MD order.  Discussed with the patient and all questions fully answered.  VSS, Skin clean, dry and intact without evidence of skin break down, no evidence of skin tears noted.  IV catheter discontinued intact. Site without signs and symptoms of complications. Dressing and pressure applied.  An After Visit Summary was printed and given to the patient.  D/c education completed with patient/family including follow up instructions, medication list, d/c activities limitations if indicated, with other d/c instructions as indicated by MD - patient able to verbalize understanding, all questions fully answered.   Patient instructed to return to ED, call 911, or call MD for any changes in condition.   Patient to be escorted via WC, and D/C home via private auto.

## 2023-09-23 NOTE — Plan of Care (Signed)
  Problem: Education: Goal: Knowledge of General Education information will improve Description: Including pain rating scale, medication(s)/side effects and non-pharmacologic comfort measures Outcome: Adequate for Discharge   Problem: Health Behavior/Discharge Planning: Goal: Ability to manage health-related needs will improve Outcome: Adequate for Discharge   Problem: Clinical Measurements: Goal: Ability to maintain clinical measurements within normal limits will improve Outcome: Adequate for Discharge Goal: Will remain free from infection Outcome: Adequate for Discharge Goal: Diagnostic test results will improve Outcome: Adequate for Discharge Goal: Respiratory complications will improve Outcome: Adequate for Discharge Goal: Cardiovascular complication will be avoided Outcome: Adequate for Discharge   Problem: Activity: Goal: Risk for activity intolerance will decrease Outcome: Adequate for Discharge   Problem: Nutrition: Goal: Adequate nutrition will be maintained Outcome: Adequate for Discharge   Problem: Coping: Goal: Level of anxiety will decrease Outcome: Adequate for Discharge   Problem: Elimination: Goal: Will not experience complications related to bowel motility Outcome: Adequate for Discharge Goal: Will not experience complications related to urinary retention Outcome: Adequate for Discharge   Problem: Pain Management: Goal: General experience of comfort will improve Outcome: Adequate for Discharge   Problem: Safety: Goal: Ability to remain free from injury will improve Outcome: Adequate for Discharge   Problem: Skin Integrity: Goal: Risk for impaired skin integrity will decrease Outcome: Adequate for Discharge   Problem: Education: Goal: Knowledge of the prescribed therapeutic regimen will improve Outcome: Adequate for Discharge   Problem: Activity: Goal: Ability to tolerate increased activity will improve Outcome: Adequate for Discharge    Problem: Health Behavior/Discharge Planning: Goal: Identification of resources available to assist in meeting health care needs will improve Outcome: Adequate for Discharge   Problem: Nutrition: Goal: Maintenance of adequate nutrition will improve Outcome: Adequate for Discharge   Problem: Clinical Measurements: Goal: Complications related to the disease process, condition or treatment will be avoided or minimized Outcome: Adequate for Discharge   Problem: Respiratory: Goal: Will regain and/or maintain adequate ventilation Outcome: Adequate for Discharge   Problem: Skin Integrity: Goal: Demonstration of wound healing without infection will improve Outcome: Adequate for Discharge

## 2023-09-23 NOTE — Progress Notes (Signed)
Patient had continuous draining during the night placed gauge and abd bandage to reinforce incision

## 2023-09-23 NOTE — Care Management Obs Status (Signed)
MEDICARE OBSERVATION STATUS NOTIFICATION   Patient Details  Name: PAWEL STVIL MRN: [id number] Date of Birth: 12-19-1945   Medicare Observation Status Notification Given:  Yes    Ronny Bacon, RN 09/23/2023, 6:59 AM

## 2023-09-24 ENCOUNTER — Encounter (HOSPITAL_COMMUNITY): Payer: Self-pay

## 2023-09-24 ENCOUNTER — Encounter (HOSPITAL_COMMUNITY)
Admission: EM | Disposition: A | Discharge status: Home / Self Care | Payer: Self-pay | Source: Other Acute Inpatient Hospital | Attending: Otolaryngology

## 2023-09-24 ENCOUNTER — Inpatient Hospital Stay (HOSPITAL_COMMUNITY)
Admission: EM | Admit: 2023-09-24 | Discharge: 2023-09-28 | DRG: 908 | Disposition: A | Discharge status: Home / Self Care | Payer: No Typology Code available for payment source | Source: Other Acute Inpatient Hospital | Attending: Otolaryngology | Admitting: Otolaryngology

## 2023-09-24 ENCOUNTER — Other Ambulatory Visit: Payer: Self-pay

## 2023-09-24 DIAGNOSIS — K91871 Postprocedural hematoma of a digestive system organ or structure following other procedure: Principal | ICD-10-CM | POA: Diagnosis present

## 2023-09-24 DIAGNOSIS — G4733 Obstructive sleep apnea (adult) (pediatric): Secondary | ICD-10-CM | POA: Diagnosis present

## 2023-09-24 DIAGNOSIS — E782 Mixed hyperlipidemia: Secondary | ICD-10-CM | POA: Diagnosis present

## 2023-09-24 DIAGNOSIS — D62 Acute posthemorrhagic anemia: Secondary | ICD-10-CM | POA: Diagnosis present

## 2023-09-24 DIAGNOSIS — S1093XA Contusion of unspecified part of neck, initial encounter: Principal | ICD-10-CM

## 2023-09-24 DIAGNOSIS — Z87891 Personal history of nicotine dependence: Secondary | ICD-10-CM | POA: Diagnosis not present

## 2023-09-24 DIAGNOSIS — S0083XA Contusion of other part of head, initial encounter: Secondary | ICD-10-CM | POA: Diagnosis present

## 2023-09-24 DIAGNOSIS — Y838 Other surgical procedures as the cause of abnormal reaction of the patient, or of later complication, without mention of misadventure at the time of the procedure: Secondary | ICD-10-CM | POA: Diagnosis not present

## 2023-09-24 DIAGNOSIS — Z8249 Family history of ischemic heart disease and other diseases of the circulatory system: Secondary | ICD-10-CM

## 2023-09-24 DIAGNOSIS — I4819 Other persistent atrial fibrillation: Secondary | ICD-10-CM | POA: Diagnosis present

## 2023-09-24 DIAGNOSIS — R1310 Dysphagia, unspecified: Secondary | ICD-10-CM | POA: Diagnosis present

## 2023-09-24 DIAGNOSIS — M479 Spondylosis, unspecified: Secondary | ICD-10-CM | POA: Diagnosis present

## 2023-09-24 DIAGNOSIS — K219 Gastro-esophageal reflux disease without esophagitis: Secondary | ICD-10-CM | POA: Diagnosis not present

## 2023-09-24 DIAGNOSIS — Z7984 Long term (current) use of oral hypoglycemic drugs: Secondary | ICD-10-CM

## 2023-09-24 DIAGNOSIS — E66812 Obesity, class 2: Secondary | ICD-10-CM | POA: Diagnosis not present

## 2023-09-24 DIAGNOSIS — E1169 Type 2 diabetes mellitus with other specified complication: Secondary | ICD-10-CM | POA: Diagnosis present

## 2023-09-24 DIAGNOSIS — Z952 Presence of prosthetic heart valve: Secondary | ICD-10-CM

## 2023-09-24 DIAGNOSIS — Z79899 Other long term (current) drug therapy: Secondary | ICD-10-CM

## 2023-09-24 DIAGNOSIS — Z6831 Body mass index (BMI) 31.0-31.9, adult: Secondary | ICD-10-CM | POA: Diagnosis not present

## 2023-09-24 DIAGNOSIS — Z8673 Personal history of transient ischemic attack (TIA), and cerebral infarction without residual deficits: Secondary | ICD-10-CM

## 2023-09-24 DIAGNOSIS — I4891 Unspecified atrial fibrillation: Secondary | ICD-10-CM | POA: Diagnosis present

## 2023-09-24 DIAGNOSIS — E876 Hypokalemia: Secondary | ICD-10-CM | POA: Diagnosis not present

## 2023-09-24 DIAGNOSIS — Z7901 Long term (current) use of anticoagulants: Secondary | ICD-10-CM | POA: Diagnosis not present

## 2023-09-24 DIAGNOSIS — H401131 Primary open-angle glaucoma, bilateral, mild stage: Secondary | ICD-10-CM | POA: Diagnosis not present

## 2023-09-24 DIAGNOSIS — I10 Essential (primary) hypertension: Secondary | ICD-10-CM | POA: Diagnosis not present

## 2023-09-24 DIAGNOSIS — E119 Type 2 diabetes mellitus without complications: Secondary | ICD-10-CM

## 2023-09-24 DIAGNOSIS — I513 Intracardiac thrombosis, not elsewhere classified: Secondary | ICD-10-CM | POA: Diagnosis present

## 2023-09-24 DIAGNOSIS — R609 Edema, unspecified: Secondary | ICD-10-CM | POA: Diagnosis present

## 2023-09-24 DIAGNOSIS — E113299 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 without macular edema, unspecified eye: Secondary | ICD-10-CM | POA: Diagnosis present

## 2023-09-24 DIAGNOSIS — T148XXA Other injury of unspecified body region, initial encounter: Principal | ICD-10-CM | POA: Diagnosis present

## 2023-09-24 HISTORY — DX: Contusion of other part of head, initial encounter: S00.83XA

## 2023-09-24 HISTORY — PX: HEMATOMA EVACUATION: SHX5118

## 2023-09-24 SURGERY — EVACUATION HEMATOMA
Anesthesia: General | Site: Neck | Laterality: Right

## 2023-09-24 MED ORDER — PROPOFOL 10 MG/ML IV BOLUS
INTRAVENOUS | Status: AC
  Filled 2023-09-24: qty 20

## 2023-09-24 MED ORDER — FENTANYL CITRATE (PF) 250 MCG/5ML IJ SOLN
INTRAMUSCULAR | Status: AC
  Filled 2023-09-24: qty 5

## 2023-09-24 MED ORDER — BACITRACIN ZINC 500 UNIT/GM EX OINT
TOPICAL_OINTMENT | CUTANEOUS | Status: AC
  Filled 2023-09-24: qty 28.35

## 2023-09-24 MED ORDER — LIDOCAINE-EPINEPHRINE 1 %-1:100000 IJ SOLN
INTRAMUSCULAR | Status: AC
  Filled 2023-09-24: qty 1

## 2023-09-24 SURGICAL SUPPLY — 32 items
CANISTER SUCT 3000ML PPV (MISCELLANEOUS) IMPLANT
CLEANER TIP ELECTROSURG 2X2 (MISCELLANEOUS) ×1 IMPLANT
COVER SURGICAL LIGHT HANDLE (MISCELLANEOUS) ×1 IMPLANT
DERMABOND ADVANCED .7 DNX12 (GAUZE/BANDAGES/DRESSINGS) IMPLANT
DRAIN CHANNEL 15F RND FF W/TCR (WOUND CARE) IMPLANT
DRAPE HALF SHEET 40X57 (DRAPES) IMPLANT
ELECT COATED BLADE 2.86 ST (ELECTRODE) ×1 IMPLANT
ELECT REM PT RETURN 9FT ADLT (ELECTROSURGICAL) ×1 IMPLANT
ELECTRODE REM PT RTRN 9FT ADLT (ELECTROSURGICAL) ×1 IMPLANT
EVACUATOR SILICONE 100CC (DRAIN) IMPLANT
GLOVE ECLIPSE 7.5 STRL STRAW (GLOVE) ×1 IMPLANT
GOWN STRL REUS W/ TWL LRG LVL3 (GOWN DISPOSABLE) ×2 IMPLANT
HEMOSTAT SURGICEL .5X2 ABSORB (HEMOSTASIS) IMPLANT
KIT BASIN OR (CUSTOM PROCEDURE TRAY) ×1 IMPLANT
KIT TURNOVER KIT B (KITS) ×1 IMPLANT
NDL HYPO 30X.5 LL (NEEDLE) ×1 IMPLANT
NDL PRECISIONGLIDE 27X1.5 (NEEDLE) IMPLANT
NEEDLE HYPO 30X.5 LL (NEEDLE) IMPLANT
NEEDLE PRECISIONGLIDE 27X1.5 (NEEDLE) IMPLANT
NS IRRIG 1000ML POUR BTL (IV SOLUTION) ×1 IMPLANT
PAD ARMBOARD 7.5X6 YLW CONV (MISCELLANEOUS) ×2 IMPLANT
PENCIL FOOT CONTROL (ELECTRODE) ×1 IMPLANT
SUT CHROMIC 4 0 SH 27 (SUTURE) IMPLANT
SUT SILK 3 0 PS 1 (SUTURE) IMPLANT
SWAB COLLECTION DEVICE MRSA (MISCELLANEOUS) ×1 IMPLANT
SWAB CULTURE ESWAB REG 1ML (MISCELLANEOUS) ×1 IMPLANT
SYR BULB IRRIG 60ML STRL (SYRINGE) IMPLANT
SYR CONTROL 10ML LL (SYRINGE) ×1 IMPLANT
TOWEL GREEN STERILE FF (TOWEL DISPOSABLE) ×1 IMPLANT
TRAY ENT MC OR (CUSTOM PROCEDURE TRAY) ×1 IMPLANT
WATER STERILE IRR 1000ML POUR (IV SOLUTION) ×1 IMPLANT
YANKAUER SUCT BULB TIP NO VENT (SUCTIONS) IMPLANT

## 2023-09-24 NOTE — Anesthesia Preprocedure Evaluation (Signed)
Anesthesia Evaluation  Patient identified by MRN, date of birth, ID band Patient awake    Reviewed: Allergy & Precautions, H&P , NPO status , Patient's Chart, lab work & pertinent test results  History of Anesthesia Complications Negative for: history of anesthetic complications  Airway Mallampati: II  TM Distance: >3 FB Neck ROM: Full    Dental no notable dental hx. (+) Edentulous Upper, Partial Lower, Dental Advisory Given   Pulmonary sleep apnea , former smoker   Pulmonary exam normal breath sounds clear to auscultation       Cardiovascular Exercise Tolerance: Good hypertension, Pt. on medications and Pt. on home beta blockers +CHF  + dysrhythmias Atrial Fibrillation  Rhythm:Regular Rate:Normal  S/p MVR / AVR (mechanical) in 2018  Mildly depressed RV/LV function. LAA clot   TTE 06/2023: IMPRESSIONS     1. LAA thrombus present in the distal tip of the appendage. Best seen  image 23 and contrast image 69. Confirmed on contrast imaging. The  thrombus has serpiginous stalk extending into the mid appendage.  Cardioversion not performed. Left atrial size was  severely dilated. A left atrial/left atrial appendage thrombus was  detected. The LAA emptying velocity was 14 cm/s.   2. Left ventricular ejection fraction, by estimation, is 45 to 50%. The  left ventricle has mildly decreased function. The left ventricle  demonstrates global hypokinesis.   3. Right ventricular systolic function is mildly reduced. The right  ventricular size is normal.   4. Right atrial size was mildly dilated.   5. Normal mechanical MV prosthesis. MG 4 mmHG @ 77 bpm. Emax 1.6 m/s, PHT  96 msec, EOA 2.95 cm2. Washing jets noted which are normal. The mitral  valve has been repaired/replaced. No evidence of mitral valve  regurgitation. There is a 25 mm mechanical  valve present in the mitral position. Procedure Date: 2018. Echo findings  are  consistent with normal structure and function of the mitral valve  prosthesis.   6. 23 mechanical AoV. Vmax 1.7 m/s, MG 5.0 mmHG, EOA 2.88 cm2, DI 0.50.  Normal prosthesis. The aortic valve has been repaired/replaced. Aortic  valve regurgitation is not visualized. There is a 23 mm St. Jude valve  present in the aortic position.  Procedure Date: 2018. Echo findings are consistent with normal structure  and function of the aortic valve prosthesis.   7. There is mild (Grade II) layered plaque involving the descending  aorta.     Neuro/Psych CVA, Residual Symptoms  negative psych ROS   GI/Hepatic Neg liver ROS,GERD  Medicated,,  Endo/Other  diabetes, Type 2, Oral Hypoglycemic Agents    Renal/GU negative Renal ROS  negative genitourinary   Musculoskeletal  (+) Arthritis , Osteoarthritis,    Abdominal   Peds  Hematology  (+) Blood dyscrasia, anemia   Anesthesia Other Findings Post op day 2 from parotidectomy. Following restart of warfarin, acute rebleed at surgical site.   Reproductive/Obstetrics negative OB ROS                              Anesthesia Physical Anesthesia Plan  ASA: 4 and emergent  Anesthesia Plan: General   Post-op Pain Management:    Induction: Intravenous and Rapid sequence  PONV Risk Score and Plan: 3 and Ondansetron, Dexamethasone and Treatment may vary due to age or medical condition  Airway Management Planned: Oral ETT and Video Laryngoscope Planned  Additional Equipment:   Intra-op Plan:   Post-operative Plan:  Extubation in OR  Informed Consent: I have reviewed the patients History and Physical, chart, labs and discussed the procedure including the risks, benefits and alternatives for the proposed anesthesia with the patient or authorized representative who has indicated his/her understanding and acceptance.     Dental advisory given  Plan Discussed with: CRNA  Anesthesia Plan Comments: ( )         Anesthesia Quick Evaluation

## 2023-09-24 NOTE — ED Triage Notes (Signed)
Pt arrived from [facility] s/p parotidectomy 12/27. Pt was started back on coumadin today and then followed with large swelling on right side of face. Pt tx here for surgical consult. Airway not compromised at present. Pt received feibin and Vit k at [facility]

## 2023-09-24 NOTE — ED Provider Notes (Signed)
[place] EMERGENCY DEPARTMENT AT [facility] Provider Note   CSN: [id number] Arrival date & time: 09/24/23  2248     History  Chief Complaint  Patient presents with   Post-op Problem    James Paul is a 77 y.o. male.  The history is provided by the patient and medical records.   James Paul is a 77 y.o. male who presents to the Emergency Department complaining of transfer for ENT.  He presents to the emergency department upon transfer from [facility] for evaluation by ENT.  He is status post right parotidectomy on Friday.  He was discharged home doing well on Saturday.  He is on warfarin with Lovenox bridge for history of mechanical heart valve.  He has been taking medications as prescribed.  Today he had increased swelling, pain to the right face.  Now has difficulty swallowing.  No fevers, difficulty breathing, nausea, vomiting.  He was seen at [facility] and had a CT face performed that demonstrated a large hematoma with compression of the right IJ, cannot rule out active extra.  Patient did have a little bit of drainage from the mid surgery site yesterday, completely resolved today.  Labs from other facility with WBC 11.3, hemoglobin 11.6, platelet 168.  Creatinine 0.9.  INR 1.1. Last dose of warfarin was last night, last dose of Lovenox was around 1030 this morning.    Home Medications Prior to Admission medications   Medication Sig Start Date End Date Taking? Authorizing Provider  acetaminophen (TYLENOL) 500 MG tablet Take 1,500 mg by mouth every 8 (eight) hours as needed for moderate pain (pain score 4-6).    [provider]  atorvastatin (LIPITOR) 40 MG tablet Take 40 mg by mouth daily.    [provider]  Cyanocobalamin (B-12) 2500 MCG TABS Take 2,500 mcg by mouth daily.    [provider]  enoxaparin (LOVENOX) 100 MG/ML injection Inject 1 mL (100 mg total) into the skin every 12 (twelve) hours. 09/12/23    James Lea, MD  latanoprost (XALATAN) 0.005 % ophthalmic solution Place 1 drop into both eyes at bedtime.    [provider]  metoprolol tartrate (LOPRESSOR) 50 MG tablet Take 50 mg by mouth 2 (two) times daily.    [provider]  pantoprazole (PROTONIX) 40 MG tablet Take 40 mg by mouth daily.    [provider]  potassium chloride (KLOR-CON) 10 MEQ tablet Take 1 tablet (10 mEq total) by mouth as directed. Take 4 tabs (40 meq) today (10/16) and then 1 tab a day starting tomorrow (10/17) Patient taking differently: Take 20 mEq by mouth daily. 07/12/23 10/10/23  James Lea, MD  sacubitril-valsartan (ENTRESTO) 49-51 MG Take 1 tablet by mouth 2 (two) times daily. 04/25/23   James Lea, MD  sitaGLIPtin (JANUVIA) 25 MG tablet Take 25 mg by mouth daily.    [provider]  warfarin (COUMADIN) 5 MG tablet TAKE 1/2 TABLET TO 1 TABLET BY MOUTH DAILY AS DIRECTED BY COUMADIN CLINIC Patient taking differently: Take 5 mg by mouth daily. TAKE 1/2 TABLET TO 1 TABLET BY MOUTH DAILY AS DIRECTED BY COUMADIN CLINIC 07/18/23   James Lea, MD      Allergies    Patient has no known allergies.    Review of Systems   Review of Systems  All other systems reviewed and are negative.   Physical Exam Updated Vital Signs BP 112/72 (BP Location: Left Arm)   Pulse  94   Temp (!) 97 F (36.1 C)   Resp 16   SpO2 98%  Physical Exam Vitals and nursing note reviewed.  Constitutional:      Appearance: He is well-developed.  HENT:     Head: Normocephalic.     Comments: There is significant firm facial swelling to the right lower face.  3 finger trismus.  No appreciable swelling in the posterior oropharynx or sublingual region.  Normal voice.  No drainage from surgical site. Cardiovascular:     Rate and Rhythm: Normal rate and regular rhythm.     Heart sounds: No murmur heard. Pulmonary:     Effort: Pulmonary effort is normal. No respiratory  distress.     Breath sounds: Normal breath sounds.  Abdominal:     Palpations: Abdomen is soft.     Tenderness: There is no abdominal tenderness. There is no guarding or rebound.  Musculoskeletal:        General: No tenderness.  Skin:    General: Skin is warm and dry.  Neurological:     Mental Status: He is alert and oriented to person, place, and time.  Psychiatric:        Behavior: Behavior normal.     ED Results / Procedures / Treatments   Labs (all labs ordered are listed, but only abnormal results are displayed) Labs Reviewed  BASIC METABOLIC PANEL - Abnormal; Notable for the following components:      Result Value   Potassium 2.4 (*)    CO2 19 (*)    Glucose, Bld 218 (*)    BUN 7 (*)    Calcium 6.9 (*)    All other components within normal limits  CBC WITH DIFFERENTIAL/PLATELET - Abnormal; Notable for the following components:   WBC 10.9 (*)    RBC 3.15 (*)    Hemoglobin 9.4 (*)    HCT 29.3 (*)    RDW 16.3 (*)    Platelets 128 (*)    Neutro Abs 9.7 (*)    Lymphs Abs 0.4 (*)    All other components within normal limits  PROTIME-INR - Abnormal; Notable for the following components:   Prothrombin Time 16.7 (*)    INR 1.3 (*)    All other components within normal limits  GLUCOSE, CAPILLARY - Abnormal; Notable for the following components:   Glucose-Capillary 275 (*)    All other components within normal limits  GLUCOSE, CAPILLARY - Abnormal; Notable for the following components:   Glucose-Capillary 251 (*)    All other components within normal limits  TYPE AND SCREEN  ABO/RH    EKG None  Radiology No results found.  Procedures Procedures    Medications Ordered in ED Medications  atorvastatin (LIPITOR) tablet 40 mg (has no administration in time range)  sacubitril-valsartan (ENTRESTO) 49-51 mg per tablet (has no administration in time range)  pantoprazole (PROTONIX) EC tablet 40 mg (has no administration in time range)  cyanocobalamin (VITAMIN B12)  tablet 2,500 mcg (has no administration in time range)  latanoprost (XALATAN) 0.005 % ophthalmic solution 1 drop (1 drop Both Eyes Given 09/25/23 0207)  potassium chloride (KLOR-CON M) CR tablet 20 mEq (has no administration in time range)  metoprolol tartrate (LOPRESSOR) tablet 50 mg (has no administration in time range)  acetaminophen (TYLENOL) tablet 1,000 mg (has no administration in time range)  dextrose 5 %-0.9 % sodium chloride infusion ( Intravenous New Bag/Given 09/25/23 0210)  cephALEXin (KEFLEX) 250 MG/5ML suspension 500 mg (500 mg Oral Given 09/25/23  0208)  potassium chloride 10 mEq in 100 mL IVPB (10 mEq Intravenous New Bag/Given 09/25/23 0237)  fentaNYL (SUBLIMAZE) injection 25-50 mcg (has no administration in time range)  acetaminophen (OFIRMEV) IV 1,000 mg (has no administration in time range)  droperidol (INAPSINE) 2.5 MG/ML injection 0.625 mg (has no administration in time range)  insulin aspart (novoLOG) injection 0-15 Units (8 Units Subcutaneous Given 09/25/23 0205)  potassium chloride 10 MEQ/100ML IVPB (  Duplicate 09/25/23 0139)  potassium chloride (KLOR-CON) packet 40 mEq (40 mEq Oral Given 09/25/23 0213)  oxyCODONE (Oxy IR/ROXICODONE) immediate release tablet 5 mg (5 mg Oral Given 09/25/23 0128)    Or  oxyCODONE (ROXICODONE) 5 MG/5ML solution 5 mg ( Oral See Alternative 09/25/23 0128)  insulin aspart (novoLOG) injection 5 Units (5 Units Subcutaneous Given 09/25/23 0122)  oxyCODONE (Oxy IR/ROXICODONE) 5 MG immediate release tablet (  Duplicate 09/25/23 0138)    ED Course/ Medical Decision Making/ A&P                                 Medical Decision Making Amount and/or Complexity of Data Reviewed Labs: ordered.  Risk Decision regarding hospitalization.   Patient transferred from [facility] for facial swelling following a recent parotidectomy currently on anticoagulation for mechanical valve.  Patient is currently protecting his airway.  Pain is  controlled.  Discussed with Dr. Pollyann Kennedy with ENT.  Plan to take to the OR for further intervention.        Final Clinical Impression(s) / ED Diagnoses Final diagnoses:  None    Rx / DC Orders ED Discharge Orders     None         Tilden Fossa, MD 09/25/23 0320

## 2023-09-24 NOTE — H&P (Signed)
James Paul is an 77 y.o. male.   Chief Complaint: Postop hematoma HPI: Patient underwent a right superficial parotidectomy Friday morning.  He was discharged Saturday morning in good condition.  He was started back on his Coumadin this morning and this afternoon developed severe sudden swelling of the right side of the face.  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     CHF (congestive heart failure) (HCC)    Cholesterol retinal embolus of 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   Cigarette smoker 07/14/2017   Compression fracture of vertebral column (HCC) 05/31/2023   Diabetes mellitus without complication (HCC)    Dysrhythmia    A. 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   Mitral valve stenosis 03/06/2023   Jun 24, 2020 Entered By: Sheryn Bison Comment: s/p mech mitral valve in 2017 @ [facility]     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 mellitus (HCC) 03/06/2023   Nicotine dependence 03/06/2023   Obesity 03/06/2023   Obstructive sleep apnea (adult) (pediatric) 03/06/2023   Primary open-angle glaucoma, bilateral, 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   Retinal artery branch occlusion, left eye  03/06/2023   Retinal ischemia 03/06/2023   Seasonal allergies    Sleep apnea    Solitary pulmonary nodule 07/13/2017   CT at [facility] ins [place] req 07/13/2017   - Spirometry 07/13/2017  FEV1 1.57 (50%)  Ratio 81      Special screening for malignant neoplasms, colon 03/06/2023   Oct 08, 2013 Entered By: Crisoforo Oxford Comment: Normal colonoscopy 12-19-12-see gi consult     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, 2018 and 03/06/2023   Stroke (HCC) 01/06/2023   weakness right arm   Tobacco use disorder 03/06/2023   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 without macular edema, unspecified eye (HCC) 03/06/2023   Wears glasses     Past Surgical History:  Procedure Laterality Date   BACK SURGERY  1984   Lumbar Spine   CARDIAC VALVE REPLACEMENT  2018   Aortic and Mitral Valve Replacement   COLONOSCOPY  2013   INGUINAL HERNIA REPAIR Left    x2   PAROTIDECTOMY Left 08/08/2014   Procedure: PAROTIDECTOMY;  Surgeon: Suzanna Obey, MD;  [facility];  Service: ENT;  Laterality: Left;   PAROTIDECTOMY Right 09/22/2023   Procedure: RIGHT PAROTIDECTOMY;  Surgeon: Christia Reading, MD;  Location: [facility] OR;  Service: ENT;  Laterality: Right;   TRANSESOPHAGEAL ECHOCARDIOGRAM (CATH LAB) N/A 07/14/2023   Procedure: TRANSESOPHAGEAL ECHOCARDIOGRAM;  Surgeon: Sande Rives, MD;  Location: [facility] INVASIVE CV LAB;  Service: Cardiovascular;  Laterality: N/A;  Family History  Problem Relation Age of Onset   Congestive Heart Failure Mother    Aneurysm Father    Congestive Heart Failure Sister    Social History:  reports that he has quit smoking. His smoking use included cigarettes. He started smoking about 57 years ago. He has a 28.1 pack-year smoking history. He has never used smokeless tobacco. He reports that he does not currently use alcohol. He reports that he does not use drugs.  Allergies: No Known Allergies  (Not in a hospital  admission)   No results found for this or any previous visit (from the past 48 hours). No results found.  ROS: otherwise negative  Blood pressure 138/74, pulse 89, temperature (!) 97.4 F (36.3 C), temperature source Axillary, resp. rate 20, SpO2 98%.  PHYSICAL EXAM: Overall appearance:  Healthy appearing, in significant discomfort Head:  Normocephalic, atraumatic. Ears: External ears look healthy. Nose: External nose is healthy in appearance. Internal nasal exam free of any lesions or obstruction. Oral Cavity/pharynx:  There are no mucosal lesions or masses identified. Hypopharynx/Larynx: Deferred. Neuro:  No identifiable neurologic deficits. Neck: Massive firm tender swelling of the right side of the face, parotidectomy surgical area consistent with hematoma.  Studies Reviewed: none    Assessment/Plan Postop hematoma.  Plan emergent evacuation under anesthesia with new drainage placement.  Will admit to the hospital postoperatively and monitor closely.  Will hold anticoagulation until tomorrow at least.   S00.83XA  Z79.01    Serena Colonel 09/24/2023, 11:33 PM

## 2023-09-25 ENCOUNTER — Emergency Department (HOSPITAL_COMMUNITY): Payer: No Typology Code available for payment source

## 2023-09-25 ENCOUNTER — Ambulatory Visit: Payer: Non-veteran care | Admitting: Neurology

## 2023-09-25 ENCOUNTER — Encounter (HOSPITAL_COMMUNITY): Payer: Self-pay | Admitting: Otolaryngology

## 2023-09-25 ENCOUNTER — Other Ambulatory Visit: Payer: Self-pay

## 2023-09-25 DIAGNOSIS — Z8673 Personal history of transient ischemic attack (TIA), and cerebral infarction without residual deficits: Secondary | ICD-10-CM

## 2023-09-25 DIAGNOSIS — E66812 Obesity, class 2: Secondary | ICD-10-CM | POA: Diagnosis present

## 2023-09-25 DIAGNOSIS — K219 Gastro-esophageal reflux disease without esophagitis: Secondary | ICD-10-CM | POA: Diagnosis present

## 2023-09-25 DIAGNOSIS — Z8249 Family history of ischemic heart disease and other diseases of the circulatory system: Secondary | ICD-10-CM | POA: Diagnosis not present

## 2023-09-25 DIAGNOSIS — E1169 Type 2 diabetes mellitus with other specified complication: Secondary | ICD-10-CM

## 2023-09-25 DIAGNOSIS — H401131 Primary open-angle glaucoma, bilateral, mild stage: Secondary | ICD-10-CM | POA: Diagnosis present

## 2023-09-25 DIAGNOSIS — K91871 Postprocedural hematoma of a digestive system organ or structure following other procedure: Secondary | ICD-10-CM | POA: Diagnosis present

## 2023-09-25 DIAGNOSIS — T148XXA Other injury of unspecified body region, initial encounter: Principal | ICD-10-CM

## 2023-09-25 DIAGNOSIS — Z952 Presence of prosthetic heart valve: Secondary | ICD-10-CM | POA: Diagnosis not present

## 2023-09-25 DIAGNOSIS — E782 Mixed hyperlipidemia: Secondary | ICD-10-CM

## 2023-09-25 DIAGNOSIS — Z6831 Body mass index (BMI) 31.0-31.9, adult: Secondary | ICD-10-CM | POA: Diagnosis not present

## 2023-09-25 DIAGNOSIS — E119 Type 2 diabetes mellitus without complications: Secondary | ICD-10-CM

## 2023-09-25 DIAGNOSIS — I10 Essential (primary) hypertension: Secondary | ICD-10-CM | POA: Diagnosis present

## 2023-09-25 DIAGNOSIS — Y838 Other surgical procedures as the cause of abnormal reaction of the patient, or of later complication, without mention of misadventure at the time of the procedure: Secondary | ICD-10-CM | POA: Diagnosis present

## 2023-09-25 DIAGNOSIS — Z87891 Personal history of nicotine dependence: Secondary | ICD-10-CM | POA: Diagnosis not present

## 2023-09-25 DIAGNOSIS — L7632 Postprocedural hematoma of skin and subcutaneous tissue following other procedure: Secondary | ICD-10-CM

## 2023-09-25 DIAGNOSIS — I509 Heart failure, unspecified: Secondary | ICD-10-CM

## 2023-09-25 DIAGNOSIS — I4819 Other persistent atrial fibrillation: Secondary | ICD-10-CM | POA: Diagnosis present

## 2023-09-25 DIAGNOSIS — I110 Hypertensive heart disease with heart failure: Secondary | ICD-10-CM | POA: Diagnosis not present

## 2023-09-25 DIAGNOSIS — G4733 Obstructive sleep apnea (adult) (pediatric): Secondary | ICD-10-CM | POA: Diagnosis present

## 2023-09-25 DIAGNOSIS — D62 Acute posthemorrhagic anemia: Secondary | ICD-10-CM | POA: Diagnosis present

## 2023-09-25 DIAGNOSIS — Z7901 Long term (current) use of anticoagulants: Secondary | ICD-10-CM

## 2023-09-25 DIAGNOSIS — R609 Edema, unspecified: Secondary | ICD-10-CM | POA: Diagnosis present

## 2023-09-25 DIAGNOSIS — Z7984 Long term (current) use of oral hypoglycemic drugs: Secondary | ICD-10-CM | POA: Diagnosis not present

## 2023-09-25 DIAGNOSIS — I513 Intracardiac thrombosis, not elsewhere classified: Secondary | ICD-10-CM | POA: Diagnosis present

## 2023-09-25 DIAGNOSIS — M479 Spondylosis, unspecified: Secondary | ICD-10-CM | POA: Diagnosis present

## 2023-09-25 DIAGNOSIS — Z79899 Other long term (current) drug therapy: Secondary | ICD-10-CM | POA: Diagnosis not present

## 2023-09-25 DIAGNOSIS — E876 Hypokalemia: Secondary | ICD-10-CM

## 2023-09-25 DIAGNOSIS — R1310 Dysphagia, unspecified: Secondary | ICD-10-CM | POA: Diagnosis present

## 2023-09-25 DIAGNOSIS — E113299 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 without macular edema, unspecified eye: Secondary | ICD-10-CM | POA: Diagnosis present

## 2023-09-25 HISTORY — DX: Acute posthemorrhagic anemia: D62

## 2023-09-25 HISTORY — DX: Personal history of transient ischemic attack (TIA), and cerebral infarction without residual deficits: Z86.73

## 2023-09-25 HISTORY — DX: Type 2 diabetes mellitus without complications: E11.9

## 2023-09-25 HISTORY — DX: Other injury of unspecified body region, initial encounter: T14.8XXA

## 2023-09-25 HISTORY — DX: Hypokalemia: E87.6

## 2023-09-25 LAB — PROTIME-INR
INR: 1.3 — ABNORMAL HIGH (ref 0.8–1.2)
Prothrombin Time: 16.7 s — ABNORMAL HIGH (ref 11.4–15.2)

## 2023-09-25 LAB — BASIC METABOLIC PANEL
Anion gap: 10 (ref 5–15)
Anion gap: 9 (ref 5–15)
BUN: 7 mg/dL — ABNORMAL LOW (ref 8–23)
BUN: 10 mg/dL (ref 8–23)
CO2: 19 mmol/L — ABNORMAL LOW (ref 22–32)
CO2: 26 mmol/L (ref 22–32)
Calcium: 6.9 mg/dL — ABNORMAL LOW (ref 8.9–10.3)
Calcium: 8.8 mg/dL — ABNORMAL LOW (ref 8.9–10.3)
Chloride: 111 mmol/L (ref 98–111)
Chloride: 105 mmol/L (ref 98–111)
Creatinine, Ser: 0.78 mg/dL (ref 0.61–1.24)
Creatinine, Ser: 1.01 mg/dL (ref 0.61–1.24)
GFR, Estimated: >60 mL/min (ref >60)
GFR, Estimated: >60 mL/min (ref >60)
Glucose, Bld: 218 mg/dL — ABNORMAL HIGH (ref 70–99)
Glucose, Bld: 158 mg/dL — ABNORMAL HIGH (ref 70–99)
Potassium: 2.4 mmol/L — CL (ref 3.5–5.1)
Potassium: 3.9 mmol/L (ref 3.5–5.1)
Sodium: 140 mmol/L (ref 135–145)
Sodium: 140 mmol/L (ref 135–145)

## 2023-09-25 LAB — CBC WITH DIFFERENTIAL/PLATELET
Abs Immature Granulocytes: 0.07 10*3/uL (ref 0.00–0.07)
Basophils Absolute: 0 10*3/uL (ref 0.0–0.1)
Basophils Relative: 0 %
Eosinophils Absolute: 0 10*3/uL (ref 0.0–0.5)
Eosinophils Relative: 0 %
HCT: 29.3 % — ABNORMAL LOW (ref 39.0–52.0)
Hemoglobin: 9.4 g/dL — ABNORMAL LOW (ref 13.0–17.0)
Immature Granulocytes: 1 %
Lymphocytes Relative: 4 %
Lymphs Abs: 0.4 10*3/uL — ABNORMAL LOW (ref 0.7–4.0)
MCH: 29.8 pg (ref 26.0–34.0)
MCHC: 32.1 g/dL (ref 30.0–36.0)
MCV: 93 fL (ref 80.0–100.0)
Monocytes Absolute: 0.6 10*3/uL (ref 0.1–1.0)
Monocytes Relative: 5 %
Neutro Abs: 9.7 10*3/uL — ABNORMAL HIGH (ref 1.7–7.7)
Neutrophils Relative %: 90 %
Platelets: 128 10*3/uL — ABNORMAL LOW (ref 150–400)
RBC: 3.15 MIL/uL — ABNORMAL LOW (ref 4.22–5.81)
RDW: 16.3 % — ABNORMAL HIGH (ref 11.5–15.5)
WBC: 10.9 10*3/uL — ABNORMAL HIGH (ref 4.0–10.5)
nRBC: 0 % (ref 0.0–0.2)

## 2023-09-25 LAB — GLUCOSE, CAPILLARY
Glucose-Capillary: 275 mg/dL — ABNORMAL HIGH (ref 70–99)
Glucose-Capillary: 251 mg/dL — ABNORMAL HIGH (ref 70–99)
Glucose-Capillary: 233 mg/dL — ABNORMAL HIGH (ref 70–99)
Glucose-Capillary: 145 mg/dL — ABNORMAL HIGH (ref 70–99)
Glucose-Capillary: 276 mg/dL — ABNORMAL HIGH (ref 70–99)
Glucose-Capillary: 124 mg/dL — ABNORMAL HIGH (ref 70–99)
Glucose-Capillary: 154 mg/dL — ABNORMAL HIGH (ref 70–99)
Glucose-Capillary: 176 mg/dL — ABNORMAL HIGH (ref 70–99)

## 2023-09-25 LAB — TYPE AND SCREEN

## 2023-09-25 LAB — MAGNESIUM: Magnesium: 1.3 mg/dL — ABNORMAL LOW (ref 1.7–2.4)

## 2023-09-25 LAB — HEMOGLOBIN AND HEMATOCRIT, BLOOD
HCT: 32.7 % — ABNORMAL LOW (ref 39.0–52.0)
Hemoglobin: 10.5 g/dL — ABNORMAL LOW (ref 13.0–17.0)

## 2023-09-25 LAB — SURGICAL PATHOLOGY

## 2023-09-25 LAB — ABO/RH

## 2023-09-25 MED ORDER — PROPOFOL 10 MG/ML IV BOLUS
INTRAVENOUS | Status: DC | PRN
  Administered 2023-09-25: 150 mg via INTRAVENOUS

## 2023-09-25 MED ORDER — POTASSIUM CHLORIDE 20 MEQ PO PACK
40.0000 meq | PACK | ORAL | Status: AC
  Administered 2023-09-25: 40 meq via ORAL
  Filled 2023-09-25: qty 2

## 2023-09-25 MED ORDER — POTASSIUM CHLORIDE CRYS ER 10 MEQ PO TBCR
20.0000 meq | EXTENDED_RELEASE_TABLET | Freq: Every day | ORAL | Status: DC
  Administered 2023-09-25 – 2023-09-28 (×4): 20 meq via ORAL
  Filled 2023-09-25 (×4): qty 2

## 2023-09-25 MED ORDER — INSULIN ASPART 100 UNIT/ML IJ SOLN
5.0000 [IU] | Freq: Once | INTRAMUSCULAR | Status: AC
  Administered 2023-09-25: 5 [IU] via SUBCUTANEOUS

## 2023-09-25 MED ORDER — PHENYLEPHRINE 80 MCG/ML (10ML) SYRINGE FOR IV PUSH (FOR BLOOD PRESSURE SUPPORT)
PREFILLED_SYRINGE | INTRAVENOUS | Status: DC | PRN
  Administered 2023-09-25 (×3): 80 ug via INTRAVENOUS

## 2023-09-25 MED ORDER — ACETAMINOPHEN 10 MG/ML IV SOLN
1000.0000 mg | Freq: Once | INTRAVENOUS | Status: DC | PRN
Start: 2023-09-25 — End: 2023-09-25

## 2023-09-25 MED ORDER — SUGAMMADEX SODIUM 200 MG/2ML IV SOLN
INTRAVENOUS | Status: DC | PRN
  Administered 2023-09-25: 300 mg via INTRAVENOUS

## 2023-09-25 MED ORDER — OXYCODONE HCL 5 MG PO TABS
5.0000 mg | ORAL_TABLET | Freq: Once | ORAL | Status: AC | PRN
  Administered 2023-09-25: 5 mg via ORAL

## 2023-09-25 MED ORDER — POTASSIUM CHLORIDE 10 MEQ/100ML IV SOLN
INTRAVENOUS | Status: AC
  Filled 2023-09-25: qty 100

## 2023-09-25 MED ORDER — SUCCINYLCHOLINE CHLORIDE 200 MG/10ML IV SOSY
PREFILLED_SYRINGE | INTRAVENOUS | Status: DC | PRN
  Administered 2023-09-25: 120 mg via INTRAVENOUS

## 2023-09-25 MED ORDER — INSULIN ASPART 100 UNIT/ML IJ SOLN
0.0000 [IU] | Freq: Three times a day (TID) | INTRAMUSCULAR | Status: DC
Start: 2023-09-25 — End: 2023-09-28
  Administered 2023-09-25: 8 [IU] via SUBCUTANEOUS
  Administered 2023-09-25: 3 [IU] via SUBCUTANEOUS
  Administered 2023-09-25: 2 [IU] via SUBCUTANEOUS
  Administered 2023-09-26 – 2023-09-27 (×4): 3 [IU] via SUBCUTANEOUS
  Administered 2023-09-27: 2 [IU] via SUBCUTANEOUS
  Administered 2023-09-27: 3 [IU] via SUBCUTANEOUS
  Administered 2023-09-28: 5 [IU] via SUBCUTANEOUS
  Administered 2023-09-28: 3 [IU] via SUBCUTANEOUS

## 2023-09-25 MED ORDER — FENTANYL CITRATE (PF) 100 MCG/2ML IJ SOLN: 25.0000 ug | INTRAMUSCULAR | Status: DC | PRN

## 2023-09-25 MED ORDER — PANTOPRAZOLE SODIUM 40 MG PO TBEC
40.0000 mg | DELAYED_RELEASE_TABLET | Freq: Every day | ORAL | Status: DC
  Administered 2023-09-25 – 2023-09-28 (×4): 40 mg via ORAL
  Filled 2023-09-25 (×4): qty 1

## 2023-09-25 MED ORDER — CHLORHEXIDINE GLUCONATE CLOTH 2 % EX PADS
6.0000 | MEDICATED_PAD | Freq: Every day | CUTANEOUS | Status: DC
  Administered 2023-09-25 – 2023-09-28 (×4): 6 via TOPICAL

## 2023-09-25 MED ORDER — ROCURONIUM BROMIDE 10 MG/ML (PF) SYRINGE
PREFILLED_SYRINGE | INTRAVENOUS | Status: DC | PRN
  Administered 2023-09-25: 20 mg via INTRAVENOUS

## 2023-09-25 MED ORDER — ACETAMINOPHEN 500 MG PO TABS
1000.0000 mg | ORAL_TABLET | Freq: Three times a day (TID) | ORAL | Status: DC | PRN
  Administered 2023-09-25 – 2023-09-27 (×3): 1000 mg via ORAL
  Filled 2023-09-25 (×3): qty 2

## 2023-09-25 MED ORDER — OXYCODONE HCL 5 MG/5ML PO SOLN
5.0000 mg | Freq: Once | ORAL | Status: AC | PRN
Start: 2023-09-25 — End: 2023-09-25

## 2023-09-25 MED ORDER — METOPROLOL TARTRATE 50 MG PO TABS
50.0000 mg | ORAL_TABLET | Freq: Two times a day (BID) | ORAL | Status: DC
  Filled 2023-09-25 (×2): qty 1

## 2023-09-25 MED ORDER — ATORVASTATIN CALCIUM 40 MG PO TABS
40.0000 mg | ORAL_TABLET | Freq: Every day | ORAL | Status: DC
  Administered 2023-09-25 – 2023-09-28 (×4): 40 mg via ORAL
  Filled 2023-09-25 (×4): qty 1

## 2023-09-25 MED ORDER — SACUBITRIL-VALSARTAN 49-51 MG PO TABS
1.0000 | ORAL_TABLET | Freq: Two times a day (BID) | ORAL | Status: DC
  Administered 2023-09-25 – 2023-09-28 (×7): 1 via ORAL
  Filled 2023-09-25 (×8): qty 1

## 2023-09-25 MED ORDER — OXYCODONE HCL 5 MG PO TABS
ORAL_TABLET | ORAL | Status: AC
  Filled 2023-09-25: qty 1

## 2023-09-25 MED ORDER — DROPERIDOL 2.5 MG/ML IJ SOLN: 0.6250 mg | Freq: Once | INTRAMUSCULAR | Status: DC | PRN

## 2023-09-25 MED ORDER — DEXTROSE-SODIUM CHLORIDE 5-0.9 % IV SOLN
INTRAVENOUS | Status: AC
Start: 2023-09-25 — End: 2023-09-25

## 2023-09-25 MED ORDER — METOPROLOL TARTRATE 5 MG/5ML IV SOLN
INTRAVENOUS | Status: AC
  Filled 2023-09-25: qty 5

## 2023-09-25 MED ORDER — ONDANSETRON HCL 4 MG/2ML IJ SOLN
INTRAMUSCULAR | Status: DC | PRN
  Administered 2023-09-25: 4 mg via INTRAVENOUS

## 2023-09-25 MED ORDER — LATANOPROST 0.005 % OP SOLN
1.0000 [drp] | Freq: Every day | OPHTHALMIC | Status: DC
  Administered 2023-09-25 – 2023-09-27 (×4): 1 [drp] via OPHTHALMIC
  Filled 2023-09-25 (×2): qty 2.5

## 2023-09-25 MED ORDER — CEPHALEXIN 250 MG/5ML PO SUSR
500.0000 mg | Freq: Two times a day (BID) | ORAL | Status: AC
  Administered 2023-09-25 (×2): 500 mg via ORAL
  Filled 2023-09-25 (×2): qty 10

## 2023-09-25 MED ORDER — POTASSIUM CHLORIDE 10 MEQ/100ML IV SOLN
10.0000 meq | INTRAVENOUS | Status: AC
Start: 2023-09-25 — End: 2023-09-25
  Administered 2023-09-25 (×4): 10 meq via INTRAVENOUS
  Filled 2023-09-25 (×2): qty 100

## 2023-09-25 MED ORDER — LACTATED RINGERS IV SOLN: INTRAVENOUS | Status: DC | PRN

## 2023-09-25 MED ORDER — LIDOCAINE 2% (20 MG/ML) 5 ML SYRINGE
INTRAMUSCULAR | Status: DC | PRN
  Administered 2023-09-25: 100 mg via INTRAVENOUS

## 2023-09-25 MED ORDER — HEPARIN (PORCINE) 25000 UT/250ML-% IV SOLN
1300.0000 [IU]/h | INTRAVENOUS | Status: DC
  Administered 2023-09-25 – 2023-09-27 (×3): 1250 [IU]/h via INTRAVENOUS
  Administered 2023-09-28: 1300 [IU]/h via INTRAVENOUS
  Filled 2023-09-25 (×4): qty 250

## 2023-09-25 MED ORDER — VITAMIN B-12 1000 MCG PO TABS
2500.0000 ug | ORAL_TABLET | Freq: Every day | ORAL | Status: DC
  Administered 2023-09-25 – 2023-09-28 (×4): 2500 ug via ORAL
  Filled 2023-09-25: qty 3
  Filled 2023-09-25: qty 2.5
  Filled 2023-09-25 (×2): qty 3

## 2023-09-25 MED ORDER — METOPROLOL TARTRATE 5 MG/5ML IV SOLN
INTRAVENOUS | Status: DC | PRN
  Administered 2023-09-25: 3 mg via INTRAVENOUS

## 2023-09-25 MED ORDER — MAGNESIUM SULFATE 2 GM/50ML IV SOLN
2.0000 g | Freq: Once | INTRAVENOUS | Status: AC
  Administered 2023-09-25: 2 g via INTRAVENOUS
  Filled 2023-09-25: qty 50

## 2023-09-25 MED ORDER — METOPROLOL TARTRATE 50 MG PO TABS
50.0000 mg | ORAL_TABLET | Freq: Two times a day (BID) | ORAL | Status: DC
  Administered 2023-09-25 – 2023-09-28 (×7): 50 mg via ORAL
  Filled 2023-09-25 (×7): qty 1

## 2023-09-25 MED ORDER — HEMOSTATIC AGENTS (NO CHARGE) OPTIME
TOPICAL | Status: DC | PRN
  Administered 2023-09-25: 1

## 2023-09-25 MED ORDER — 0.9 % SODIUM CHLORIDE (POUR BTL) OPTIME
TOPICAL | Status: DC | PRN
  Administered 2023-09-25: 1000 mL

## 2023-09-25 NOTE — Transfer of Care (Signed)
Immediate Anesthesia Transfer of Care Note  Patient: James Paul  Procedure(s) Performed: EVACUATION RIGHT NECK HEMATOMA (Right: Neck)  Patient Location: PACU  Anesthesia Type:General  Level of Consciousness: awake, alert , and oriented  Airway & Oxygen Therapy: Patient Spontanous Breathing and Patient connected to nasal cannula oxygen  Post-op Assessment: Report given to RN and Post -op Vital signs reviewed and stable  Post vital signs: Reviewed and stable  Last Vitals:  Vitals Value Taken Time  BP 123/66 09/25/23 0100  Temp    Pulse 99 09/25/23 0107  Resp 21 09/25/23 0107  SpO2 95 % 09/25/23 0107  Vitals shown include unfiled device data.  Last Pain:  Vitals:   09/24/23 2301  TempSrc: Axillary  PainSc:          Complications: No notable events documented.

## 2023-09-25 NOTE — Progress Notes (Signed)
Subjective: Doing well, no complaints.  Pain relieved significantly since the procedure.  Objective: Vital signs in last 24 hours: Temp:  [97 F (36.1 C)-98.2 F (36.8 C)] 98.2 F (36.8 C) (12/30 0938) Pulse Rate:  [77-108] 81 (12/30 0938) Resp:  [11-20] 14 (12/30 0900) BP: (108-161)/(62-93) 161/79 (12/30 0938) SpO2:  [92 %-100 %] 100 % (12/30 0938) Weight change:     Intake/Output from previous day: 12/29 0701 - 12/30 0700 In: 1030 [P.O.:210; I.V.:500; IV Piggyback:320] Out: 450 [Urine:450] Intake/Output this shift: No intake/output data recorded.  PHYSICAL EXAM: Awake and alert.  Drain is functioning holding a seal.  No swelling or hematoma.  Facial nerve function is normal.  Lab Results: Recent Labs    09/24/23 2306  WBC 10.9*  HGB 9.4*  HCT 29.3*  PLT 128*   BMET Recent Labs    09/24/23 2306  NA 140  K 2.4*  CL 111  CO2 19*  GLUCOSE 218*  BUN 7*  CREATININE 0.78  CALCIUM 6.9*    Studies/Results: No results found.  Medications: I have reviewed the patient's current medications.  Assessment/Plan: Stable postop as far as the hematoma is concerned.  I have discussed with cardiology and he is a very high risk for adverse events if not anticoagulated.  And going to have the hospitalist service consult on him but will likely plan to start IV heparin later today.  Will plan to keep the drain in for at least 3 days.  LOS: 0 days      Serena Colonel 09/25/2023, 10:01 AM

## 2023-09-25 NOTE — Progress Notes (Signed)
I was assisting Raynelle Fanning, RN with patient. Patient CBG on arrival to PACU was 275, so Dr. Charlynn Grimes gave a verbal order for 5 units novolog subcutaneous once. This was given. When patient was put on hold and we started following floor orders we noticed there was no sliding scale order. I called Dr. Pollyann Kennedy and over the phone he gave verbal orders for the Glycemic control order set and told me what to select from the order set. I confirmed each one by repeating his order to him. I informed him that we had given him insulin approx an hour ago and had rechecked his CBG and it was 251 so I asked if he wanted [place] to start using the sliding scale now or hold off on giving more insulin. He told me to start using the sliding scale now. I relayed all this information to Raynelle Fanning, RN the primary nurse. Patient was medicated as ordered.

## 2023-09-25 NOTE — Consult Note (Signed)
Initial Consultation Note   Patient: James Paul ZOX:[id number] DOB: 1946-08-31 PCP: Crisoforo Oxford, DO DOA: 09/24/2023 DOS: the patient was seen and examined on 09/25/2023 Primary service: Serena Colonel, MD  Referring physician: Dr. Pollyann Kennedy Reason for consult: Medical management  Assessment/Plan: Assessment and Plan: Postoperative hematoma Parotid mass After right parotidectomy by Dr. Jenne Pane on 12/27 patient had resumed anticoagulation and subsequently developed significant neck swelling.  Presented back to the hospital was noted to have large hematoma on the right side of face.  Patient underwent emergent evacuation by Dr. Pollyann Kennedy with new drain placement on 12/29.  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 -Per ENT  Acute blood loss anemia Hemoglobin 9.4 on admission, but had been 11.5 when checked prior to surgery on 12/20.  Thought secondary to surgery as well as postoperative hematoma that had to be evacuated. -Recheck H&H -Consider need of transfusion of blood products if hemoglobin less than 8 g/dL  Hypokalemia Acute.  On admission potassium noted to be 2.4.  Patient had been given a total of 80 mEq in PACU. -Recheck BMP and magnesium -Replace electrolytes as needed  Persistent atrial fibrillation History of left atrial thrombus Chronic anticoagulation Patient had resumed Lovenox and Coumadin following his procedure prior to hematoma. INR goal is 3-3.5. CHA2DS2-VASc score equal to at least 6. -Resume heparin drip this evening at lower dose per pharmacy -Monitor overnight and ensure H&H stable without significant bleeding  Controlled diabetes mellitus type 2, without long-term use of insulin Home medication regimen includes Januvia 25 mg daily. -Hypoglycemic protocol -Holding Januvia -Patient had been placed on a moderate sliding scale of insulin.  Consider decreasing to a sensitive sliding scale insulin lows  History of  mitral and aortic valve replacement  Essential hypertension -Continue Entresto and metoprolol  History of CVA -Continue statin, beta-blocker, and plans to resume anticoagulation  Hyperlipidemia -Continue atorvastatin    TRH will continue to follow the patient.  HPI: SURYANSH CRAIGEN is a 77 y.o. male with past medical history of hypertension, persistent atrial fibrillation on chronic anticoagulation, s/p mitral along with aortic valve replacement, CVA, left atrial thrombus, PAD, diabetes mellitus type 2, tobacco abuse, and prior left parotid mass s/p resection in 2021 presented due to right neck swelling after having  right parotidectomy performed by Dr. Jenne Pane on 12/27 for a large right parotid mass.  The following day after the procedure patient had his JP drain removed and was ultimately discharged home and recommended to resume anticoagulation.  He reported resuming anticoagulation upon morning.  He had taken a nap that afternoon and when he woke up reported reported that his neck was half the size of a squash.  His swelling did not cause any breathing or swallowing difficulties. The patient expressed distress over the swelling and the associated discomfort, describing the experience as "terrible."  Patient was noted to have a large hematoma and taken for emergent surgery by Dr. Pollyann Kennedy on 12/29 with new drain placement.  Review of Systems: As mentioned in the history of present illness. All other systems reviewed and are negative. Past Medical History:  Diagnosis Date   Anemia, unspecified 03/06/2023   Aortic valve replaced 03/14/2023   Arthritis    Back   Atrial fibrillation (HCC) 03/06/2023   Cataract    B/L early cataracts   Cerebral infarction [facility]) 03/06/2023   Feb 01, 2023 Entered By: Crisoforo Oxford Comment: RUE weakness + facial droop-[place],see jlv.  CHF (congestive heart failure) (HCC)    Cholesterol retinal embolus of 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   Cigarette smoker  07/14/2017   Compression fracture of vertebral column (HCC) 05/31/2023   Diabetes mellitus without complication (HCC)    Dysrhythmia    A. 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/08/2014   Mitral valve stenosis 03/06/2023   Jun 24, 2020 Entered By: Sheryn Bison Comment: s/p mech mitral valve in 2017 @ [facility]     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 mellitus (HCC) 03/06/2023   Nicotine dependence 03/06/2023   Obesity 03/06/2023   Obstructive sleep apnea (adult) (pediatric) 03/06/2023   Primary open-angle glaucoma, bilateral, 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   Retinal artery branch occlusion, 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   Retinal ischemia 03/06/2023   Seasonal allergies    Sleep apnea    Solitary pulmonary nodule 07/13/2017   CT at [facility] ins [place] req 07/13/2017   - Spirometry 07/13/2017  FEV1 1.57 (50%)  Ratio 81      Special screening for malignant neoplasms, colon 03/06/2023   Oct 08, 2013 Entered By: Crisoforo Oxford Comment: Normal colonoscopy 12-19-12-see gi consult     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, 2018 and 03/06/2023   Stroke (HCC) 01/06/2023   weakness right arm   Tobacco use disorder 03/06/2023   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 without macular edema, unspecified eye (HCC) 03/06/2023   Wears glasses    Past Surgical History:  Procedure Laterality Date   BACK SURGERY  1984   Lumbar Spine    CARDIAC VALVE REPLACEMENT  2018   Aortic and Mitral Valve Replacement   COLONOSCOPY  2013   HEMATOMA EVACUATION Right 09/24/2023   Procedure: EVACUATION RIGHT NECK HEMATOMA;  Surgeon: Serena Colonel, MD;  Location: [facility] OR;  Service: ENT;  Laterality: Right;   INGUINAL HERNIA REPAIR Left    x2   PAROTIDECTOMY Left 08/08/2014   Procedure: PAROTIDECTOMY;  Surgeon: Suzanna Obey, MD;  Location: [facility] OR;  Service: ENT;  Laterality: Left;   PAROTIDECTOMY Right 09/22/2023   Procedure: RIGHT PAROTIDECTOMY;  Surgeon: Christia Reading, MD;  Location: [facility] OR;  Service: ENT;  Laterality: Right;   TRANSESOPHAGEAL ECHOCARDIOGRAM (CATH LAB) N/A 07/14/2023   Procedure: TRANSESOPHAGEAL ECHOCARDIOGRAM;  Surgeon: Sande Rives, MD;  Location: [facility] INVASIVE CV LAB;  Service: Cardiovascular;  Laterality: N/A;   Social History:  reports that he has quit smoking. His smoking use included cigarettes. He started smoking about 57 years ago. He has a 28.1 pack-year smoking history. He has never used smokeless tobacco. He reports that he does not currently use alcohol. He reports that he does not use drugs.  No Known Allergies  Family History  Problem Relation Age of Onset   Congestive Heart Failure Mother    Aneurysm Father    Congestive Heart Failure Sister     Prior to Admission medications   Medication Sig Start Date  End Date Taking? Authorizing Provider  acetaminophen (TYLENOL) 500 MG tablet Take 1,500 mg by mouth every 8 (eight) hours as needed for moderate pain (pain score 4-6).   Yes [provider]  atorvastatin (LIPITOR) 40 MG tablet Take 40 mg by mouth daily.   Yes [provider]  Cyanocobalamin (B-12) 2500 MCG TABS Take 2,500 mcg by mouth daily.   Yes [provider]  enoxaparin (LOVENOX) 100 MG/ML injection Inject 1 mL (100 mg total) into the skin every 12 (twelve) hours. 09/12/23  Yes Georgeanna Lea, MD  latanoprost (XALATAN) 0.005 % ophthalmic solution Place 1 drop into  both eyes at bedtime.   Yes [provider]  metoprolol tartrate (LOPRESSOR) 50 MG tablet Take 50 mg by mouth 2 (two) times daily.   Yes [provider]  pantoprazole (PROTONIX) 40 MG tablet Take 40 mg by mouth daily.   Yes [provider]  potassium chloride (KLOR-CON) 10 MEQ tablet Take 1 tablet (10 mEq total) by mouth as directed. Take 4 tabs (40 meq) today (10/16) and then 1 tab a day starting tomorrow (10/17) Patient taking differently: Take 20 mEq by mouth daily. 07/12/23 10/10/23 Yes Georgeanna Lea, MD  sacubitril-valsartan (ENTRESTO) 49-51 MG Take 1 tablet by mouth 2 (two) times daily. 04/25/23  Yes Georgeanna Lea, MD  sitaGLIPtin (JANUVIA) 25 MG tablet Take 25 mg by mouth daily.   Yes [provider]  traMADol (ULTRAM) 50 MG tablet Take 50 mg by mouth every 8 (eight) hours as needed for moderate pain (pain score 4-6).   Yes [provider]  warfarin (COUMADIN) 5 MG tablet TAKE 1/2 TABLET TO 1 TABLET BY MOUTH DAILY AS DIRECTED BY COUMADIN CLINIC Patient taking differently: Take 5 mg by mouth daily. Take 5mg  (1 tablet) by mouth every day of the week except on Thursday's, take 7.5mg  (1 and 1/2 tablet) 07/18/23  Yes Georgeanna Lea, MD    Physical Exam: Vitals:   09/25/23 0813 09/25/23 0900 09/25/23 0938 09/25/23 1426  BP:  116/73 (!) 161/79 130/63  Pulse: 86 87 81 (!) 102  Resp: 15 14    Temp:   98.2 F (36.8 C) 98 F (36.7 C)  TempSrc:      SpO2: 97% 95% 100% 96%    Constitutional: Elderly male currently in no acute distress Eyes: PERRL, lids and conjunctivae normal ENMT: Mucous membranes are moist.  Normal dentition.  Neck:  supple with JP drain present on the right side of the neck with serosanguineous drainage. Respiratory: clear to auscultation bilaterally, no wheezing, no crackles. Normal respiratory effort. No accessory muscle use.  Cardiovascular: Irregular irregular, no murmurs / rubs / gallops. No extremity edema.  2+ pedal pulses.   Abdomen: no tenderness, no masses palpated.  Bowel sounds positive.  Musculoskeletal: no clubbing / cyanosis. No joint deformity upper and lower extremities. Good ROM, no contractures. Normal muscle tone.  Skin: no rashes, lesions, ulcers. No induration Neurologic: CN 2-12 grossly intact. Sensation intact, DTR normal. Strength 5/5 in all 4.  Psychiatric: Normal judgment and insight. Alert and oriented x 3. Normal mood.   Data Reviewed:   Reviewed labs, imaging, and pertinent records as documented   Family Communication: none Primary team communication:  Thank you very much for involving [place] in the care of your patient.  Author: Clydie Braun, MD 09/25/2023 5:47 PM  For on call review [email or web address].

## 2023-09-25 NOTE — Plan of Care (Signed)
  Problem: Education: Goal: Ability to describe self-care measures that may prevent or decrease complications (Diabetes Survival Skills Education) will improve Outcome: Progressing Goal: Individualized Educational Video(s) Outcome: Progressing   Problem: Coping: Goal: Ability to adjust to condition or change in health will improve Outcome: Progressing   Problem: Fluid Volume: Goal: Ability to maintain a balanced intake and output will improve Outcome: Progressing   Problem: Health Behavior/Discharge Planning: Goal: Ability to identify and utilize available resources and services will improve Outcome: Progressing Goal: Ability to manage health-related needs will improve Outcome: Progressing   Problem: Metabolic: Goal: Ability to maintain appropriate glucose levels will improve Outcome: Progressing   Problem: Nutritional: Goal: Maintenance of adequate nutrition will improve Outcome: Progressing Goal: Progress toward achieving an optimal weight will improve Outcome: Progressing   Problem: Skin Integrity: Goal: Risk for impaired skin integrity will decrease Outcome: Progressing   Problem: Tissue Perfusion: Goal: Adequacy of tissue perfusion will improve Outcome: Progressing   Problem: Education: Goal: Knowledge of the prescribed therapeutic regimen will improve Outcome: Progressing   Problem: Activity: Goal: Ability to tolerate increased activity will improve Outcome: Progressing   Problem: Health Behavior/Discharge Planning: Goal: Identification of resources available to assist in meeting health care needs will improve Outcome: Progressing   Problem: Nutrition: Goal: Maintenance of adequate nutrition will improve Outcome: Progressing   Problem: Clinical Measurements: Goal: Complications related to the disease process, condition or treatment will be avoided or minimized Outcome: Progressing   Problem: Respiratory: Goal: Will regain and/or maintain adequate  ventilation Outcome: Progressing   Problem: Skin Integrity: Goal: Demonstration of wound healing without infection will improve Outcome: Progressing   Problem: Education: Goal: Knowledge of General Education information will improve Description: Including pain rating scale, medication(s)/side effects and non-pharmacologic comfort measures Outcome: Progressing   Problem: Health Behavior/Discharge Planning: Goal: Ability to manage health-related needs will improve Outcome: Progressing   Problem: Clinical Measurements: Goal: Ability to maintain clinical measurements within normal limits will improve Outcome: Progressing Goal: Will remain free from infection Outcome: Progressing Goal: Diagnostic test results will improve Outcome: Progressing Goal: Respiratory complications will improve Outcome: Progressing Goal: Cardiovascular complication will be avoided Outcome: Progressing   Problem: Activity: Goal: Risk for activity intolerance will decrease Outcome: Progressing   Problem: Nutrition: Goal: Adequate nutrition will be maintained Outcome: Progressing   Problem: Coping: Goal: Level of anxiety will decrease Outcome: Progressing   Problem: Elimination: Goal: Will not experience complications related to bowel motility Outcome: Progressing Goal: Will not experience complications related to urinary retention Outcome: Progressing   Problem: Pain Management: Goal: General experience of comfort will improve Outcome: Progressing   Problem: Safety: Goal: Ability to remain free from injury will improve Outcome: Progressing   Problem: Skin Integrity: Goal: Risk for impaired skin integrity will decrease Outcome: Progressing

## 2023-09-25 NOTE — Progress Notes (Signed)
PHARMACY - ANTICOAGULATION CONSULT NOTE  Pharmacy Consult for heparin infusion Indication: AVR / MVR w/ history of atrial fibrillation   No Known Allergies  Patient Measurements:   Heparin Dosing Weight: 88.4 kg  Vital Signs: Temp: 98 F (36.7 C) (12/30 1426) BP: 130/63 (12/30 1426) Pulse Rate: 102 (12/30 1426)  Labs: Recent Labs    09/24/23 2306  HGB 9.4*  HCT 29.3*  PLT 128*  LABPROT 16.7*  INR 1.3*  CREATININE 0.78    Estimated Creatinine Clearance: 89.4 mL/min (by C-G formula based on SCr of 0.78 mg/dL).   Medical History: Past Medical History:  Diagnosis Date   Anemia, unspecified 03/06/2023   Aortic valve replaced 03/14/2023   Arthritis    Back   Atrial fibrillation (HCC) 03/06/2023   Cataract    B/L early cataracts   Cerebral infarction [facility]) 03/06/2023   Feb 01, 2023 Entered By: Crisoforo Oxford Comment: RUE weakness + facial droop-[facility],see jlv.     CHF (congestive heart failure) (HCC)    Cholesterol retinal embolus of 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   Cigarette smoker 07/14/2017   Compression fracture of vertebral column (HCC) 05/31/2023   Diabetes mellitus without complication (HCC)    Dysrhythmia    A. 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   Mitral valve stenosis 03/06/2023   Jun 24, 2020 Entered By: Sheryn Bison Comment: s/p mech mitral valve in 2017 @ [facility]     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 mellitus (HCC) 03/06/2023    Nicotine dependence 03/06/2023   Obesity 03/06/2023   Obstructive sleep apnea (adult) (pediatric) 03/06/2023   Primary open-angle glaucoma, bilateral, 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   Retinal artery branch occlusion, 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   Retinal ischemia 03/06/2023   Seasonal allergies    Sleep apnea    Solitary pulmonary nodule 07/13/2017   CT at [facility] ins [place] req 07/13/2017   - Spirometry 07/13/2017  FEV1 1.57 (50%)  Ratio 81      Special screening for malignant neoplasms, colon 03/06/2023   Oct 08, 2013 Entered By: Crisoforo Oxford Comment: Normal colonoscopy 12-19-12-see gi consult     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, 2018 and 03/06/2023   Stroke (HCC) 01/06/2023   weakness right arm   Tobacco use disorder 03/06/2023   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 without macular edema, unspecified eye (HCC) 03/06/2023   Wears glasses     Medications:  Medications Prior to Admission  Medication Sig Dispense Refill Last Dose/Taking   acetaminophen (TYLENOL) 500 MG tablet Take 1,500 mg by mouth every 8 (eight) hours as needed for moderate pain (pain score 4-6).   09/24/2023   atorvastatin (LIPITOR) 40 MG tablet Take 40 mg by mouth daily.   Past Week   Cyanocobalamin (B-12) 2500 MCG TABS Take 2,500 mcg by mouth daily.   09/24/2023   enoxaparin (LOVENOX) 100 MG/ML injection Inject 1 mL (100  mg total) into the skin every 12 (twelve) hours. 20 mL 1 09/24/2023 at 10:30 AM   latanoprost (XALATAN) 0.005 % ophthalmic solution Place 1 drop into both eyes at bedtime.   Past Week   metoprolol tartrate (LOPRESSOR) 50 MG tablet Take 50 mg by mouth 2 (two) times daily.   09/24/2023 Morning   pantoprazole (PROTONIX) 40 MG tablet Take 40 mg by mouth daily.   09/24/2023   potassium chloride (KLOR-CON) 10 MEQ tablet Take 1 tablet (10 mEq total) by mouth as directed. Take 4 tabs (40 meq) today (10/16) and then 1 tab a day starting tomorrow  (10/17) (Patient taking differently: Take 20 mEq by mouth daily.) 90 tablet 3 09/24/2023   sacubitril-valsartan (ENTRESTO) 49-51 MG Take 1 tablet by mouth 2 (two) times daily. 180 tablet 3 09/24/2023   sitaGLIPtin (JANUVIA) 25 MG tablet Take 25 mg by mouth daily.   09/24/2023   traMADol (ULTRAM) 50 MG tablet Take 50 mg by mouth every 8 (eight) hours as needed for moderate pain (pain score 4-6).   09/24/2023   warfarin (COUMADIN) 5 MG tablet TAKE 1/2 TABLET TO 1 TABLET BY MOUTH DAILY AS DIRECTED BY COUMADIN CLINIC (Patient taking differently: Take 5 mg by mouth daily. Take 5mg  (1 tablet) by mouth every day of the week except on Thursday's, take 7.5mg  (1 and 1/2 tablet)) 75 tablet 1 09/23/2023    Assessment: 77 YOM recently underwent a right parotid hematoma evacuation. He has a PMH significant for AVR/MVR on AC in the form of lovenox. Last dose given was 09/24/2023 10:30 AM. He is at high risk for adverse events without proper anticoagulation.   Goal of Therapy:  Heparin level 0.3-0.5 units/ml Monitor platelets by anticoagulation protocol: Yes   Plan:  Start heparin infusion at 1250 units/hr starting 09/25/2023 at 22:00 Check anti-Xa level in 8 hours and daily while on heparin Continue to monitor H&H and platelets   Dorianne Perret BS, PharmD, BCPS Clinical Pharmacist 09/25/2023 6:24 PM  Contact: [phone] after 3 PM  "Be curious, not judgmental..." -Debbora Dus

## 2023-09-25 NOTE — Op Note (Signed)
OPERATIVE REPORT  DATE OF SURGERY: 09/25/2023  PATIENT:  James Paul,  77 y.o. male  PRE-OPERATIVE DIAGNOSIS:  POST-OP HEMATOMA, right parotid  POST-OPERATIVE DIAGNOSIS:  POST-OP HEMATOMA, right parotid  PROCEDURE:  Procedure(s): EVACUATION RIGHT NECK HEMATOMA  SURGEON:  Susy Frizzle, MD  ASSISTANTS: None  ANESTHESIA:   General   EBL: 50 ml  DRAINS: 15 French round  LOCAL MEDICATIONS USED:  None  SPECIMEN:  none  COUNTS:  Correct  PROCEDURE DETAILS: The patient was taken to the operating room and placed on the operating table in the supine position. Following induction of general endotracheal anesthesia, the right face and neck were prepped and draped in standard fashion.  The parotidectomy sutures were removed.  The wound was opened and a large hematoma was partially liquefied and was evacuated using suction.  The surgical bed was irrigated with saline.  The facial nerve was easily identified and was intact.  There were 2 small sites that were slightly oozing right along the course of the facial nerve.  2 small pieces of Surgicel were placed on this.  Valsalva maneuvers by anesthesia did not elicit any additional bleeding.  The drain was then exited through a separate stab incision and secured in place with a silk suture.  The incision was then reapproximated with a running subcuticular 4-0 chromic and then Dermabond was used on the skin.  The drain was charged.  There is no further bleeding.  The patient was awakened extubated and transferred to recovery in stable condition.    PATIENT DISPOSITION:  To PACU, stable

## 2023-09-25 NOTE — Anesthesia Postprocedure Evaluation (Signed)
Anesthesia Post Note  Patient: James Paul  Procedure(s) Performed: EVACUATION RIGHT NECK HEMATOMA (Right: Neck)     Patient location during evaluation: PACU Anesthesia Type: General Level of consciousness: awake and alert Pain management: pain level controlled Vital Signs Assessment: post-procedure vital signs reviewed and stable Respiratory status: spontaneous breathing, nonlabored ventilation, respiratory function stable and patient connected to nasal cannula oxygen Cardiovascular status: blood pressure returned to baseline and stable Postop Assessment: no apparent nausea or vomiting Anesthetic complications: no   No notable events documented.  Last Vitals:  Vitals:   09/25/23 0938 09/25/23 1426  BP: (!) 161/79 130/63  Pulse: 81 (!) 102  Resp:    Temp: 36.8 C 36.7 C  SpO2: 100% 96%    Last Pain:  Vitals:   09/25/23 1000  TempSrc:   PainSc: 0-No pain                 [place]

## 2023-09-25 NOTE — Progress Notes (Signed)
Rechecked patients blood sugar to ensure he was not hypoglycemic after receiving SQ insulin earlier. Sliding scale is to start at 0800. No insulin is due at this time. Patient is sleeping quietly, woke easily and is A&O x 4. Denies pain and is in no distress.

## 2023-09-25 NOTE — Anesthesia Procedure Notes (Signed)
Procedure Name: Intubation Date/Time: 09/25/2023 12:20 AM  Performed by: Gwenyth Allegra, CRNAPre-anesthesia Checklist: Patient identified, Emergency Drugs available, Suction available, Patient being monitored and Timeout performed Patient Re-evaluated:Patient Re-evaluated prior to induction Oxygen Delivery Method: Circle system utilized Preoxygenation: Pre-oxygenation with 100% oxygen Induction Type: IV induction and Cricoid Pressure applied Laryngoscope Size: Glidescope and 4 Grade View: Grade II Tube type: Oral Tube size: 7.5 mm Number of attempts: 1 Airway Equipment and Method: Patient positioned with wedge pillow, Stylet and Video-laryngoscopy Placement Confirmation: ETT inserted through vocal cords under direct vision, positive ETCO2 and breath sounds checked- equal and bilateral Secured at: 22 cm Tube secured with: Tape Dental Injury: Teeth and Oropharynx as per pre-operative assessment

## 2023-09-26 DIAGNOSIS — T148XXA Other injury of unspecified body region, initial encounter: Secondary | ICD-10-CM

## 2023-09-26 LAB — GLUCOSE, CAPILLARY
Glucose-Capillary: 199 mg/dL — ABNORMAL HIGH (ref 70–99)
Glucose-Capillary: 172 mg/dL — ABNORMAL HIGH (ref 70–99)
Glucose-Capillary: 199 mg/dL — ABNORMAL HIGH (ref 70–99)
Glucose-Capillary: 167 mg/dL — ABNORMAL HIGH (ref 70–99)

## 2023-09-26 LAB — CBC
HCT: 30.9 % — ABNORMAL LOW (ref 39.0–52.0)
Hemoglobin: 10.1 g/dL — ABNORMAL LOW (ref 13.0–17.0)
MCH: 29.9 pg (ref 26.0–34.0)
MCHC: 32.7 g/dL (ref 30.0–36.0)
MCV: 91.4 fL (ref 80.0–100.0)
Platelets: DECREASED 10*3/uL (ref 150–400)
RBC: 3.38 MIL/uL — ABNORMAL LOW (ref 4.22–5.81)
RDW: 17 % — ABNORMAL HIGH (ref 11.5–15.5)
WBC: 7.3 10*3/uL (ref 4.0–10.5)
nRBC: 0 % (ref 0.0–0.2)

## 2023-09-26 LAB — BASIC METABOLIC PANEL
Anion gap: 9 (ref 5–15)
BUN: 11 mg/dL (ref 8–23)
CO2: 24 mmol/L (ref 22–32)
Calcium: 8.3 mg/dL — ABNORMAL LOW (ref 8.9–10.3)
Chloride: 103 mmol/L (ref 98–111)
Creatinine, Ser: 0.88 mg/dL (ref 0.61–1.24)
GFR, Estimated: >60 mL/min (ref >60)
Glucose, Bld: 201 mg/dL — ABNORMAL HIGH (ref 70–99)
Potassium: 3.4 mmol/L — ABNORMAL LOW (ref 3.5–5.1)
Sodium: 136 mmol/L (ref 135–145)

## 2023-09-26 LAB — HEPARIN LEVEL (UNFRACTIONATED)
Heparin Unfractionated: 0.43 [IU]/mL (ref 0.30–0.70)
Heparin Unfractionated: 0.47 [IU]/mL (ref 0.30–0.70)

## 2023-09-26 LAB — MAGNESIUM: Magnesium: 1.7 mg/dL (ref 1.7–2.4)

## 2023-09-26 NOTE — Progress Notes (Signed)
 PHARMACY - ANTICOAGULATION CONSULT NOTE  Pharmacy Consult for heparin  infusion Indication: AVR / MVR w/ history of atrial fibrillation   No Known Allergies  Patient Measurements:   Heparin  Dosing Weight: 88.4 kg  Vital Signs: Temp: 98.2 F (36.8 C) (12/31 1605) Temp Source: Oral (12/31 1605) BP: 132/77 (12/31 1605) Pulse Rate: 89 (12/31 1605)  Labs: Recent Labs    09/24/23 2306 09/25/23 1837 09/26/23 0628 09/26/23 0840 09/26/23 1345  HGB 9.4* 10.5* 10.1*  --   --   HCT 29.3* 32.7* 30.9*  --   --   PLT 128*  --  PLATELET CLUMPS NOTED ON SMEAR, COUNT APPEARS DECREASED  --   --   LABPROT 16.7*  --   --   --   --   INR 1.3*  --   --   --   --   HEPARINUNFRC  --   --  0.43  --  0.47  CREATININE 0.78 1.01  --  0.88  --     Estimated Creatinine Clearance: 81.3 mL/min (by C-G formula based on SCr of 0.88 mg/dL).   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     CHF (congestive heart failure) (HCC)    Cholesterol retinal embolus of 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   Cigarette smoker 07/14/2017   Compression fracture of vertebral column (HCC) 05/31/2023   Diabetes mellitus without complication (HCC)    Dysrhythmia    A. 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   Mitral valve stenosis 03/06/2023   Jun 24, 2020 Entered By: WOODROW ALEENE ORN Comment: s/p mech mitral valve in 2017 @ [facility]     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 mellitus (HCC) 03/06/2023   Nicotine dependence 03/06/2023   Obesity 03/06/2023   Obstructive sleep apnea (adult) (pediatric) 03/06/2023   Primary open-angle glaucoma, bilateral, 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   Retinal artery branch occlusion, 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   Retinal ischemia 03/06/2023   Seasonal allergies    Sleep apnea    Solitary pulmonary nodule 07/13/2017   CT at [facility] ins [place] req 07/13/2017   - Spirometry 07/13/2017  FEV1 1.57 (50%)  Ratio 81      Special screening for malignant neoplasms, colon 03/06/2023   Oct 08, 2013 Entered By: BONNY DANELLE DEL Comment: Normal colonoscopy 12-19-12-see gi consult     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   09/24/2023   atorvastatin  (LIPITOR) 40 MG tablet Take 40 mg by mouth daily.   Past Week   Cyanocobalamin  (B-12) 2500 MCG TABS Take 2,500 mcg by mouth daily.   09/24/2023   enoxaparin  (LOVENOX ) 100 MG/ML injection Inject 1 mL (100 mg total) into the skin every 12 (twelve) hours. 20 mL 1 09/24/2023 at 10:30 AM   latanoprost  (XALATAN ) 0.005 % ophthalmic solution Place 1 drop into both eyes at bedtime.   Past Week   metoprolol  tartrate (LOPRESSOR ) 50 MG tablet Take  50 mg by mouth 2 (two) times daily.   09/24/2023 Morning   pantoprazole  (PROTONIX ) 40 MG tablet Take 40 mg by mouth daily.   09/24/2023   potassium chloride  (KLOR-CON ) 10 MEQ tablet Take 1 tablet (10 mEq total) by mouth as directed. Take 4 tabs (40 meq) today (10/16) and then 1 tab a day starting tomorrow (10/17) (Patient taking differently: Take 20 mEq by mouth daily.) 90 tablet 3 09/24/2023   sacubitril -valsartan  (ENTRESTO ) 49-51 MG Take 1 tablet by mouth 2 (two) times daily. 180 tablet 3 09/24/2023   sitaGLIPtin (JANUVIA) 25 MG tablet Take 25 mg by mouth daily.   09/24/2023   traMADol (ULTRAM) 50 MG tablet Take 50 mg by mouth every 8 (eight) hours as needed for moderate pain (pain score 4-6).   09/24/2023   warfarin (COUMADIN ) 5 MG tablet TAKE 1/2 TABLET TO 1 TABLET BY MOUTH DAILY AS DIRECTED BY COUMADIN  CLINIC (Patient taking differently: Take 5 mg by mouth daily. Take 5mg  (1 tablet) by mouth every day of the week except on Thursday's, take 7.5mg  (1 and 1/2 tablet)) 75 tablet 1 09/23/2023    Assessment: 77 YOM recently underwent a right parotid hematoma evacuation. He has a PMH significant for AVR/MVR on AC in the form of lovenox . Last dose given was 09/24/2023 10:30 AM.   Heparin  level this afternoon is within goal range at 0.47.  No overt bleeding or complications noted.    Goal of Therapy:  Heparin  level 0.3-0.5 units/ml Monitor platelets by anticoagulation protocol: Yes   Plan:  Continue IV heparin  at 1250 units/hr. Daily heparin  level and CBC.  Harlene Barlow, Berdine JONETTA CORP, BCCP Clinical Pharmacist  09/26/2023 4:11 PM   [facility] pharmacy phone numbers are listed on [email or web address]

## 2023-09-26 NOTE — Progress Notes (Signed)
 Subjective: Feeling much better.  Objective: Vital signs in last 24 hours: Temp:  [97.7 F (36.5 C)-98.2 F (36.8 C)] 97.7 F (36.5 C) (12/31 0935) Pulse Rate:  [86-104] 87 (12/31 0935) Resp:  [16-20] 17 (12/31 0935) BP: (130-148)/(63-101) 147/101 (12/31 0935) SpO2:  [93 %-98 %] 93 % (12/31 0935) Weight change:  Last BM Date : 09/24/23 (per pt)  Intake/Output from previous day: 12/30 0701 - 12/31 0700 In: 443.5 [P.O.:300; I.V.:98.5; IV Piggyback:45] Out: 280 [Urine:260; Drains:20] Intake/Output this shift: No intake/output data recorded.  PHYSICAL EXAM: Awake and alert.  Neck looks good.  No swelling or hematoma.  Drain is holding a seal and drainage is serosanguineous.  Lab Results: Recent Labs    09/24/23 2306 09/25/23 1837 09/26/23 0628  WBC 10.9*  --  7.3  HGB 9.4* 10.5* 10.1*  HCT 29.3* 32.7* 30.9*  PLT 128*  --  PLATELET CLUMPS NOTED ON SMEAR, COUNT APPEARS DECREASED   BMET Recent Labs    09/24/23 2306 09/25/23 1837  NA 140 140  K 2.4* 3.9  CL 111 105  CO2 19* 26  GLUCOSE 218* 158*  BUN 7* 10  CREATININE 0.78 1.01  CALCIUM  6.9* 8.8*    Studies/Results: No results found.  Medications: I have reviewed the patient's current medications.  Assessment/Plan: Stable postop, no residual bleeding or hematoma.  Started back on intravenous heparin  and will continue to monitor.  Appreciate input from hospitalist service.  If there is no further signs of bleeding by Thursday I would recommend that we transition to oral Coumadin  and consider removing the drain and discharge home then.  LOS: 1 day      Ida Loader 09/26/2023, 10:47 AM

## 2023-09-26 NOTE — Plan of Care (Signed)
  Problem: Education: Goal: Ability to describe self-care measures that may prevent or decrease complications (Diabetes Survival Skills Education) will improve Outcome: Progressing Goal: Individualized Educational Video(s) Outcome: Progressing   Problem: Coping: Goal: Ability to adjust to condition or change in health will improve Outcome: Progressing   Problem: Fluid Volume: Goal: Ability to maintain a balanced intake and output will improve Outcome: Progressing   Problem: Health Behavior/Discharge Planning: Goal: Ability to identify and utilize available resources and services will improve Outcome: Progressing Goal: Ability to manage health-related needs will improve Outcome: Progressing   Problem: Metabolic: Goal: Ability to maintain appropriate glucose levels will improve Outcome: Progressing   Problem: Nutritional: Goal: Maintenance of adequate nutrition will improve Outcome: Progressing Goal: Progress toward achieving an optimal weight will improve Outcome: Progressing   Problem: Skin Integrity: Goal: Risk for impaired skin integrity will decrease Outcome: Progressing   Problem: Tissue Perfusion: Goal: Adequacy of tissue perfusion will improve Outcome: Progressing   Problem: Education: Goal: Knowledge of the prescribed therapeutic regimen will improve Outcome: Progressing   Problem: Activity: Goal: Ability to tolerate increased activity will improve Outcome: Progressing   Problem: Health Behavior/Discharge Planning: Goal: Identification of resources available to assist in meeting health care needs will improve Outcome: Progressing   Problem: Nutrition: Goal: Maintenance of adequate nutrition will improve Outcome: Progressing   Problem: Clinical Measurements: Goal: Complications related to the disease process, condition or treatment will be avoided or minimized Outcome: Progressing   Problem: Respiratory: Goal: Will regain and/or maintain adequate  ventilation Outcome: Progressing   Problem: Skin Integrity: Goal: Demonstration of wound healing without infection will improve Outcome: Progressing   Problem: Education: Goal: Knowledge of General Education information will improve Description: Including pain rating scale, medication(s)/side effects and non-pharmacologic comfort measures Outcome: Progressing   Problem: Health Behavior/Discharge Planning: Goal: Ability to manage health-related needs will improve Outcome: Progressing   Problem: Clinical Measurements: Goal: Ability to maintain clinical measurements within normal limits will improve Outcome: Progressing Goal: Will remain free from infection Outcome: Progressing Goal: Diagnostic test results will improve Outcome: Progressing Goal: Respiratory complications will improve Outcome: Progressing Goal: Cardiovascular complication will be avoided Outcome: Progressing   Problem: Activity: Goal: Risk for activity intolerance will decrease Outcome: Progressing   Problem: Nutrition: Goal: Adequate nutrition will be maintained Outcome: Progressing   Problem: Coping: Goal: Level of anxiety will decrease Outcome: Progressing   Problem: Elimination: Goal: Will not experience complications related to bowel motility Outcome: Progressing Goal: Will not experience complications related to urinary retention Outcome: Progressing   Problem: Pain Management: Goal: General experience of comfort will improve Outcome: Progressing   Problem: Safety: Goal: Ability to remain free from injury will improve Outcome: Progressing   Problem: Skin Integrity: Goal: Risk for impaired skin integrity will decrease Outcome: Progressing

## 2023-09-26 NOTE — Progress Notes (Signed)
 PHARMACY - ANTICOAGULATION  Pharmacy Consult for heparin  infusion Indication: AVR / MVR, Atrial fibrillation L atrial thrombus  Brief A/P: Heparin  level within goal range Continue Heparin  at current rate    No Known Allergies  Patient Measurements:   Heparin  Dosing Weight: 88.4 kg  Vital Signs: Temp: 98 F (36.7 C) (12/31 0300) Temp Source: Oral (12/31 0300) BP: 134/65 (12/31 0300) Pulse Rate: 86 (12/31 0300)  Labs: Recent Labs    09/24/23 2306 09/25/23 1837 09/26/23 0628  HGB 9.4* 10.5* 10.1*  HCT 29.3* 32.7* 30.9*  PLT 128*  --  PENDING  LABPROT 16.7*  --   --   INR 1.3*  --   --   HEPARINUNFRC  --   --  0.43  CREATININE 0.78 1.01  --     Estimated Creatinine Clearance: 70.8 mL/min (by C-G formula based on SCr of 1.01 mg/dL).   Assessment: 77 yo male with h/o mechanical AVR/MVR and Afib, recent L atrial thrombus, s/p parotid hematoma evacuation 12/30, for heparin .   Goal of Therapy:  Heparin  level 0.3-0.5 units/ml Monitor platelets by anticoagulation protocol: Yes   Plan:  No change to heparin   Check heparin  level in 8 hours to verify   Cathlyn Arrant, PharmD, BCPS

## 2023-09-26 NOTE — Progress Notes (Signed)
 PROGRESS NOTE    [name]  FMW:[id number] DOB: 05/22/1946 DOA: 09/24/2023 PCP: Bonny Danelle DEL, DO    Brief Narrative:   James Paul is a 77 y.o. male with past medical history significant for hypertension, persistent atrial fibrillation on chronic anticoagulation, s/p mitral along with aortic valve replacement, CVA, left atrial thrombus, PAD, diabetes mellitus type 2, tobacco abuse, and prior left parotid mass s/p resection in 2021 presented due to right neck swelling after having  right parotidectomy performed by Dr. Carlie on 12/27 for a large right parotid mass.  The following day after the procedure patient had his JP drain removed and was ultimately discharged home and recommended to resume anticoagulation.  He reported resuming anticoagulation upon morning.  He had taken a nap that afternoon and when he woke up reported reported that his neck was half the size of a squash.  His swelling did not cause any breathing or swallowing difficulties. The patient expressed distress over the swelling and the associated discomfort, describing the experience as terrible.  Patient was noted to have a large hematoma and taken for emergent surgery by Dr. Jesus on 12/29 with new drain placement.   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 Carlie on 12/27.  Following procedure patient resumed anticoagulation and unfortunately developed significant neck swelling and presented back to the hospital for evaluation.  He was noted to have a large hematoma on the right side of his face and underwent emergent evacuation by ENT, Dr. Jesus with drain placement on 09/24/2023.  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 -- Continue to monitor drain output, 20 mL out reported past 24h -- Further per  ENT   Acute blood loss anemia Hemoglobin 9.4 on admission, but had been 11.5 when checked prior to surgery on 12/20.  Thought secondary to surgery as well as postoperative hematoma that was evacuated postoperatively. -- Hgb  9.4>10.5>10.1; stable -- Transfuse for hemoglobin less than 8 g/dL -- Monitor drain output as above, CBC daily   Hypokalemia On admission potassium noted to be 2.4.  Patient had been given a total of 80 mEq in PACU. -- BMP and magnesium  in a.m.   Persistent atrial fibrillation History of left atrial thrombus Chronic anticoagulation Patient had resumed Lovenox  and warfarin following his procedure prior to hematoma. INR goal is 3-3.5. CHA2DS2-VASc score equal to at least 6. -- Continue heparin  drip -- Timing of transition back to warfarin per ENT; potentially Thursday per their note today   Type 2 diabetes mellitus. Home medication regimen includes Januvia 25 mg daily.  Hemoglobin A1c 5.5 on 09/15/2023, well-controlled. -- Hold home Januvia while inpatient -- Moderate SSI for coverage -- CBG before every meal/at bedtime   History of mitral and aortic valve replacement On warfarin outpatient with INR goal 3-3.5. -- Currently on heparin  drip as above   Essential hypertension -- Continue Entresto  and metoprolol    History of CVA -- Continue statin, beta-blocker -- On heparin  drip as above   Hyperlipidemia -- Continue atorvastatin   GERD -- Protonix  40 mg p.o. daily  Obesity, class II BMI 31.93.  Complicates all facets of care   DVT prophylaxis: SCDs Start: 09/25/23 0138    Code Status: Full Code Family Communication: No family present bedside this morning  Disposition Plan:  Level of care:  Med-Surg Status is: Inpatient Remains inpatient appropriate because: Heparin  drip, disposition per primary ENT      Subjective: Patient seen examined bedside, resting calmly.  Lying in bed.  RN present at bedside.  20 mL blood in right-sided neck drain past  24 hours.  Patient with no specific complaints, overall feels well and denies any pain or significant edema currently to the right side of his neck.  Seen by ENT, will continue on heparin  drip to ensure stability of hemoglobin and decreased drain output with potential transition back to warfarin on Thursday.  No other specific questions, concerns or complaints at this time.  Denies headache, no dizziness, no chest pain, no palpitations, no shortness of breath, no abdominal pain, no fever/chills/night sweats, no nausea/vomiting/diarrhea, no focal weakness, no fatigue, no paresthesia.  No acute events overnight per nursing.  Objective: Vitals:   09/25/23 2118 09/25/23 2300 09/26/23 0300 09/26/23 0935  BP: (!) 145/86 (!) 148/76 134/65 (!) 147/101  Pulse: (!) 104 90 86 87  Resp:  20 18 17   Temp:  98.2 F (36.8 C) 98 F (36.7 C) 97.7 F (36.5 C)  TempSrc:  Oral Oral Oral  SpO2:  97% 98% 93%    Intake/Output Summary (Last 24 hours) at 09/26/2023 1101 Last data filed at 09/26/2023 9366 Gross per 24 hour  Intake 443.49 ml  Output 280 ml  Net 163.49 ml   There were no vitals filed for this visit.  Examination:  Physical Exam: GEN: NAD, alert and oriented x 3, obese HEENT: NCAT, PERRL, EOMI, sclera clear, MMM, drain noted to right neck with small amount of blood w/in collection bulb. PULM: CTAB w/o wheezes/crackles, normal respiratory effort, on room air CV: RRR w/o M/G/R GI: abd soft, NTND, NABS, no R/G/M MSK: no peripheral edema, muscle strength globally intact 5/5 bilateral upper/lower extremities NEURO: CN II-XII intact, no focal deficits, sensation to light touch intact PSYCH: normal mood/affect Integumentary: dry/intact, no rashes or wounds    Data Reviewed: I have personally reviewed following labs and imaging studies  CBC: Recent Labs  Lab 09/24/23 2306 09/25/23 1837 09/26/23 0628  WBC 10.9*  --  7.3  NEUTROABS 9.7*  --   --   HGB 9.4* 10.5* 10.1*  HCT 29.3* 32.7*  30.9*  MCV 93.0  --  91.4  PLT 128*  --  PLATELET CLUMPS NOTED ON SMEAR, COUNT APPEARS DECREASED   Basic Metabolic Panel: Recent Labs  Lab 09/24/23 2306 09/25/23 1837  NA 140 140  K 2.4* 3.9  CL 111 105  CO2 19* 26  GLUCOSE 218* 158*  BUN 7* 10  CREATININE 0.78 1.01  CALCIUM  6.9* 8.8*  MG  --  1.3*   GFR: Estimated Creatinine Clearance: 70.8 mL/min (by C-G formula based on SCr of 1.01 mg/dL). Liver Function Tests: No results for input(s): AST, ALT, ALKPHOS, BILITOT, PROT, ALBUMIN in the last 168 hours. No results for input(s): LIPASE, AMYLASE in the last 168 hours. No results for input(s): AMMONIA in the last 168 hours. Coagulation Profile: Recent Labs  Lab 09/22/23 0621 09/24/23 2306  INR 1.3* 1.3*   Cardiac Enzymes: No results for input(s): CKTOTAL, CKMB, CKMBINDEX, TROPONINI in the last 168 hours. BNP (last 3 results) No results for input(s): PROBNP in the last 8760 hours. HbA1C: No results for input(s): HGBA1C in the last 72 hours. CBG: Recent Labs  Lab 09/25/23 0813 09/25/23 0940 09/25/23 1331 09/25/23 2044 09/26/23 0920  GLUCAP 145* 124* 154* 176* 199*   Lipid Profile: No  results for input(s): CHOL, HDL, LDLCALC, TRIG, CHOLHDL, LDLDIRECT in the last 72 hours. Thyroid Function Tests: No results for input(s): TSH, T4TOTAL, FREET4, T3FREE, THYROIDAB in the last 72 hours. Anemia Panel: No results for input(s): VITAMINB12, FOLATE, FERRITIN, TIBC, IRON, RETICCTPCT in the last 72 hours. Sepsis Labs: No results for input(s): PROCALCITON, LATICACIDVEN in the last 168 hours.  No results found for this or any previous visit (from the past 240 hours).       Radiology Studies: No results found.      Scheduled Meds:  atorvastatin   40 mg Oral Daily   Chlorhexidine  Gluconate Cloth  6 each Topical Daily   cyanocobalamin   2,500 mcg Oral Daily   insulin  aspart  0-15 Units Subcutaneous TID  WC   latanoprost   1 drop Both Eyes QHS   metoprolol  tartrate  50 mg Oral BID   pantoprazole   40 mg Oral Daily   potassium chloride   20 mEq Oral Daily   sacubitril -valsartan   1 tablet Oral BID   Continuous Infusions:  heparin  1,250 Units/hr (09/26/23 0633)     LOS: 1 day    Time spent: 52 minutes spent on chart review, discussion with nursing staff, consultants, updating family and interview/physical exam; more than 50% of that time was spent in counseling and/or coordination of care.    Camellia PARAS Valinda Fedie, DO [facility] Available via Epic secure chat 7am-7pm After these hours, please refer to coverage provider listed on [email or web address] 09/26/2023, 11:01 AM

## 2023-09-27 LAB — HEPARIN LEVEL (UNFRACTIONATED): Heparin Unfractionated: 0.46 [IU]/mL (ref 0.30–0.70)

## 2023-09-27 LAB — CBC
HCT: 32.2 % — ABNORMAL LOW (ref 39.0–52.0)
Hemoglobin: 10.3 g/dL — ABNORMAL LOW (ref 13.0–17.0)
MCH: 29.3 pg (ref 26.0–34.0)
MCHC: 32 g/dL (ref 30.0–36.0)
MCV: 91.7 fL (ref 80.0–100.0)
Platelets: 119 10*3/uL — ABNORMAL LOW (ref 150–400)
RBC: 3.51 MIL/uL — ABNORMAL LOW (ref 4.22–5.81)
RDW: 16.9 % — ABNORMAL HIGH (ref 11.5–15.5)
WBC: 7.7 10*3/uL (ref 4.0–10.5)
nRBC: 0 % (ref 0.0–0.2)

## 2023-09-27 LAB — BASIC METABOLIC PANEL
Anion gap: 9 (ref 5–15)
BUN: 8 mg/dL (ref 8–23)
CO2: 26 mmol/L (ref 22–32)
Calcium: 8.9 mg/dL (ref 8.9–10.3)
Chloride: 102 mmol/L (ref 98–111)
Creatinine, Ser: 0.86 mg/dL (ref 0.61–1.24)
GFR, Estimated: >60 mL/min (ref >60)
Glucose, Bld: 216 mg/dL — ABNORMAL HIGH (ref 70–99)
Potassium: 4 mmol/L (ref 3.5–5.1)
Sodium: 137 mmol/L (ref 135–145)

## 2023-09-27 LAB — GLUCOSE, CAPILLARY
Glucose-Capillary: 144 mg/dL — ABNORMAL HIGH (ref 70–99)
Glucose-Capillary: 172 mg/dL — ABNORMAL HIGH (ref 70–99)
Glucose-Capillary: 172 mg/dL — ABNORMAL HIGH (ref 70–99)

## 2023-09-27 LAB — MAGNESIUM: Magnesium: 1.7 mg/dL (ref 1.7–2.4)

## 2023-09-27 NOTE — Progress Notes (Signed)
   09/27/23 2051  BiPAP/CPAP/SIPAP  $ Non-Invasive Ventilator  Non-Invasive Vent Initial  $ Face Mask Medium Yes  BiPAP/CPAP/SIPAP Pt Type Adult  BiPAP/CPAP/SIPAP Resmed  Mask Type Full face mask  Mask Size Medium  PEEP 5 cmH20  FiO2 (%) 32 %  Flow Rate 3 lpm  Patient Home Equipment No  Auto Titrate No

## 2023-09-27 NOTE — Progress Notes (Signed)
 PHARMACY - ANTICOAGULATION CONSULT NOTE  Pharmacy Consult for heparin  infusion Indication: AVR / MVR w/ history of atrial fibrillation   No Known Allergies  Patient Measurements:   Heparin  Dosing Weight: 88.4 kg  Vital Signs: Temp: 97.7 F (36.5 C) (01/01 0750) Temp Source: Oral (01/01 0750) BP: 145/88 (01/01 0750) Pulse Rate: 88 (01/01 0750)  Labs: Recent Labs    09/24/23 2306 09/25/23 1837 09/26/23 0628 09/26/23 0840 09/26/23 1345 09/27/23 0825  HGB 9.4* 10.5* 10.1*  --   --  10.3*  HCT 29.3* 32.7* 30.9*  --   --  32.2*  PLT 128*  --  PLATELET CLUMPS NOTED ON SMEAR, COUNT APPEARS DECREASED  --   --  119*  LABPROT 16.7*  --   --   --   --   --   INR 1.3*  --   --   --   --   --   HEPARINUNFRC  --   --  0.43  --  0.47 0.46  CREATININE 0.78 1.01  --  0.88  --   --     Estimated Creatinine Clearance: 81.3 mL/min (by C-G formula based on SCr of 0.88 mg/dL).   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     CHF (congestive heart failure) (HCC)    Cholesterol retinal embolus of 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   Cigarette smoker 07/14/2017   Compression fracture of vertebral column (HCC) 05/31/2023   Diabetes mellitus without complication (HCC)    Dysrhythmia    A. 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   Mitral valve stenosis 03/06/2023   Jun 24, 2020 Entered By: WOODROW ALEENE ORN Comment: s/p mech mitral valve in 2017 @ [facility]     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 mellitus (HCC) 03/06/2023   Nicotine dependence 03/06/2023   Obesity 03/06/2023   Obstructive sleep apnea (adult) (pediatric) 03/06/2023   Primary open-angle glaucoma, bilateral, 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   Retinal artery branch occlusion, 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   Retinal ischemia 03/06/2023   Seasonal allergies    Sleep apnea    Solitary pulmonary nodule 07/13/2017   CT at [facility] ins [place] req 07/13/2017   - Spirometry 07/13/2017  FEV1 1.57 (50%)  Ratio 81      Special screening for malignant neoplasms, colon 03/06/2023   Oct 08, 2013 Entered By: BONNY DANELLE DEL Comment: Normal colonoscopy 12-19-12-see gi consult     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   09/24/2023   atorvastatin  (LIPITOR) 40 MG tablet Take 40 mg by mouth daily.   Past Week   Cyanocobalamin  (B-12) 2500 MCG TABS Take 2,500 mcg by mouth daily.   09/24/2023   enoxaparin  (LOVENOX ) 100 MG/ML injection Inject 1 mL (100 mg total) into the skin every 12 (twelve) hours. 20 mL 1 09/24/2023 at 10:30 AM   latanoprost  (XALATAN ) 0.005 % ophthalmic solution Place 1 drop into both eyes at bedtime.   Past Week    metoprolol  tartrate (LOPRESSOR ) 50 MG tablet Take 50 mg by mouth 2 (two) times daily.   09/24/2023 Morning   pantoprazole  (PROTONIX ) 40 MG tablet Take 40 mg by mouth daily.   09/24/2023   potassium chloride  (KLOR-CON ) 10 MEQ tablet Take 1 tablet (10 mEq total) by mouth as directed. Take 4 tabs (40 meq) today (10/16) and then 1 tab a day starting tomorrow (10/17) (Patient taking differently: Take 20 mEq by mouth daily.) 90 tablet 3 09/24/2023   sacubitril -valsartan  (ENTRESTO ) 49-51 MG Take 1 tablet by mouth 2 (two) times daily. 180 tablet 3 09/24/2023   sitaGLIPtin (JANUVIA) 25 MG tablet Take 25 mg by mouth daily.   09/24/2023   traMADol (ULTRAM) 50 MG tablet Take 50 mg by mouth every 8 (eight) hours as needed for moderate pain (pain score 4-6).   09/24/2023   warfarin (COUMADIN ) 5 MG tablet TAKE 1/2 TABLET TO 1 TABLET BY MOUTH DAILY AS DIRECTED BY COUMADIN  CLINIC (Patient taking differently: Take 5 mg by mouth daily. Take 5mg  (1 tablet) by mouth every day of the week except on Thursday's, take 7.5mg  (1 and 1/2 tablet)) 75 tablet 1 09/23/2023    Assessment: 77 YOM recently underwent a right parotid hematoma evacuation. He has a PMH significant for AVR/MVR on AC in the form of lovenox . Last dose given was 09/24/2023 10:30 AM.   Heparin  level this morning is therapeutic at 0.46 on 1250 units/hr. Hgb (10.3) stable, PLT count stable (119). No issues with infusion and no new bleeding noted per RN. Noted ENT plan to transition to warfarin on 1/2.   Goal of Therapy:  Heparin  level 0.3-0.5 units/ml Monitor platelets by anticoagulation protocol: Yes   Plan:  Continue IV heparin  at 1250 units/hr. Daily heparin  level and CBC. Follow-up transition to warfarin.  Josefa Range, PharmD PGY1 Pharmacy Resident 09/27/2023 9:22 AM

## 2023-09-27 NOTE — Progress Notes (Signed)
 Subjective: No new complaints.  Not sleeping well.  Would like to wear CPAP but he does not have his machine with him.  Objective: Vital signs in last 24 hours: Temp:  [97.4 F (36.3 C)-98.2 F (36.8 C)] 97.7 F (36.5 C) (01/01 0750) Pulse Rate:  [85-96] 88 (01/01 0750) Resp:  [17-18] 17 (01/01 0750) BP: (132-157)/(77-101) 145/88 (01/01 0750) SpO2:  [93 %-98 %] 96 % (01/01 0750) Weight change:  Last BM Date : 09/24/23  Intake/Output from previous day: 12/31 0701 - 01/01 0700 In: -  Out: 630 [Urine:600; Drains:30] Intake/Output this shift: No intake/output data recorded.  PHYSICAL EXAM: Awake and alert.  A little more swelling and bruising today but the drain is still holding suction and the flap is still down.  Drain output is decreasing nicely.  Lab Results: Recent Labs    09/24/23 2306 09/25/23 1837 09/26/23 0628  WBC 10.9*  --  7.3  HGB 9.4* 10.5* 10.1*  HCT 29.3* 32.7* 30.9*  PLT 128*  --  PLATELET CLUMPS NOTED ON SMEAR, COUNT APPEARS DECREASED   BMET Recent Labs    09/25/23 1837 09/26/23 0840  NA 140 136  K 3.9 3.4*  CL 105 103  CO2 26 24  GLUCOSE 158* 201*  BUN 10 11  CREATININE 1.01 0.88  CALCIUM  8.8* 8.3*    Studies/Results: No results found.  Medications: I have reviewed the patient's current medications.  Assessment/Plan: Progressing nicely.  Transition to oral Coumadin  tomorrow and remove the drain.  Consider discharge then.  LOS: 2 days      Ida Loader 09/27/2023, 8:34 AM

## 2023-09-27 NOTE — Progress Notes (Signed)
 PROGRESS NOTE    James Paul  FMW:[id number] DOB: 01-23-1946 DOA: 09/24/2023 PCP: Bonny Danelle DEL, DO    Brief Narrative:   James Paul is a 78 y.o. male with past medical history significant for hypertension, persistent atrial fibrillation on chronic anticoagulation, s/p mitral along with aortic valve replacement, CVA, left atrial thrombus, PAD, diabetes mellitus type 2, tobacco abuse, and prior left parotid mass s/p resection in 2021 presented due to right neck swelling after having  right parotidectomy performed by Dr. Carlie on 12/27 for a large right parotid mass.  The following day after the procedure patient had his JP drain removed and was ultimately discharged home and recommended to resume anticoagulation.  He reported resuming anticoagulation upon morning.  He had taken a nap that afternoon and when he woke up reported reported that his neck was half the size of a squash.  His swelling did not cause any breathing or swallowing difficulties. The patient expressed distress over the swelling and the associated discomfort, describing the experience as terrible.  Patient was noted to have a large hematoma and taken for emergent surgery by Dr. Jesus on 12/29 with new drain placement.   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 Carlie on 12/27.  Following procedure patient resumed anticoagulation and unfortunately developed significant neck swelling and presented back to the hospital for evaluation.  He was noted to have a large hematoma on the right side of his face and underwent emergent evacuation by ENT, Dr. Jesus with drain placement on 09/24/2023.  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 -- Continue to monitor drain output, 30 mL out reported past 24h -- Further per  ENT   Acute blood loss anemia Hemoglobin 9.4 on admission, but had been 11.5 when checked prior to surgery on 12/20.  Thought secondary to surgery as well as postoperative hematoma that was evacuated postoperatively. -- Hgb  9.4>10.5>10.1>10.3; stable -- Transfuse for hemoglobin less than 8 g/dL -- Monitor drain output as above, 30 mL past 24 hours  -- CBC daily   Hypokalemia: Resolved Repleted at time of admission.  Potassium 4.0 this morning.   Persistent atrial fibrillation History of left atrial thrombus Chronic anticoagulation Patient had resumed Lovenox  and warfarin following his procedure prior to hematoma. INR goal is 3-3.5. CHA2DS2-VASc score equal to at least 6. -- Continue heparin  drip -- Timing of transition back to warfarin per ENT; potentially tomorrow per their note today   Type 2 diabetes mellitus. Home medication regimen includes Januvia 25 mg daily.  Hemoglobin A1c 5.5 on 09/15/2023, well-controlled. -- Hold home Januvia while inpatient -- Moderate SSI for coverage -- CBG before every meal/at bedtime   History of mitral and aortic valve replacement On warfarin outpatient with INR goal 3-3.5. -- Currently on heparin  drip as above   Essential hypertension -- Continue Entresto  and metoprolol    History of CVA -- Continue statin, beta-blocker -- On heparin  drip as above   Hyperlipidemia -- Continue atorvastatin   GERD -- Protonix  40 mg p.o. 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  Complicates all facets of care   DVT prophylaxis: SCDs Start: 09/25/23 0138    Code Status: Full Code Family Communication: No family present bedside this morning  Disposition Plan:  Level of care: Med-Surg Status  is: Inpatient Remains inpatient appropriate because: Heparin  drip, disposition per primary ENT      Subjective: Patient seen examined bedside, resting calmly.  Lying in bed.  Sleeping but easily arousable.  No specific complaints this  morning.  Hemoglobin remained stable.  Drain remains in place with roughly 30 mL out past 24 hours.  Seen by ENT with potential transitioning heparin  drip back to his home warfarin tomorrow.   No other specific questions, concerns or complaints at this time.  Denies headache, no dizziness, no chest pain, no palpitations, no shortness of breath, no abdominal pain, no fever/chills/night sweats, no nausea/vomiting/diarrhea, no focal weakness, no fatigue, no paresthesia.  No acute events overnight per nursing.  Objective: Vitals:   09/26/23 2037 09/27/23 0058 09/27/23 0458 09/27/23 0750  BP: (!) 157/81 (!) 144/79 (!) 150/78 (!) 145/88  Pulse: 96 85 86 88  Resp: 18 18 18 17   Temp: 97.6 F (36.4 C) 97.6 F (36.4 C) (!) 97.4 F (36.3 C) 97.7 F (36.5 C)  TempSrc: Oral Oral Oral Oral  SpO2: 97% 94% 96% 96%    Intake/Output Summary (Last 24 hours) at 09/27/2023 1118 Last data filed at 09/27/2023 9373 Gross per 24 hour  Intake --  Output 630 ml  Net -630 ml   There were no vitals filed for this visit.  Examination:  Physical Exam: GEN: NAD, alert and oriented x 3, obese HEENT: NCAT, PERRL, EOMI, sclera clear, MMM, + TTP, + edema and eccymosis right anterior right ear region; drain noted to right neck with small amount of blood w/in collection bulb. PULM: CTAB w/o wheezes/crackles, normal respiratory effort, on room air CV: RRR w/o M/G/R GI: abd soft, NTND, NABS, no R/G/M MSK: no peripheral edema, muscle strength globally intact 5/5 bilateral upper/lower extremities NEURO: CN II-XII intact, no focal deficits, sensation to light touch intact PSYCH: normal mood/affect Integumentary: dry/intact, no rashes or wounds    Data Reviewed: I have personally reviewed following labs and imaging studies  CBC: Recent Labs  Lab 09/24/23 2306 09/25/23 1837 09/26/23 0628 09/27/23 0825  WBC 10.9*  --  7.3 7.7  NEUTROABS 9.7*  --   --   --   HGB 9.4* 10.5* 10.1* 10.3*  HCT 29.3* 32.7* 30.9* 32.2*   MCV 93.0  --  91.4 91.7  PLT 128*  --  PLATELET CLUMPS NOTED ON SMEAR, COUNT APPEARS DECREASED 119*   Basic Metabolic Panel: Recent Labs  Lab 09/24/23 2306 09/25/23 1837 09/26/23 0840 09/27/23 0825  NA 140 140 136 137  K 2.4* 3.9 3.4* 4.0  CL 111 105 103 102  CO2 19* 26 24 26   GLUCOSE 218* 158* 201* 216*  BUN 7* 10 11 8   CREATININE 0.78 1.01 0.88 0.86  CALCIUM  6.9* 8.8* 8.3* 8.9  MG  --  1.3* 1.7 1.7   GFR: Estimated Creatinine Clearance: 83.1 mL/min (by C-G formula based on SCr of 0.86 mg/dL). Liver Function Tests: No results for input(s): AST, ALT, ALKPHOS, BILITOT, PROT, ALBUMIN in the last 168 hours. No results for input(s): LIPASE, AMYLASE in the last 168 hours. No results for input(s): AMMONIA in the last 168 hours. Coagulation Profile: Recent Labs  Lab 09/22/23 0621 09/24/23 2306  INR 1.3* 1.3*   Cardiac Enzymes: No results for input(s): CKTOTAL, CKMB, CKMBINDEX, TROPONINI in the last 168 hours. BNP (last 3 results) No results for input(s): PROBNP in the last 8760 hours. HbA1C: No results for input(s): HGBA1C in the last 72 hours. CBG: Recent Labs  Lab  09/26/23 0920 09/26/23 1216 09/26/23 1606 09/26/23 2037 09/27/23 0751  GLUCAP 199* 172* 199* 167* 144*   Lipid Profile: No results for input(s): CHOL, HDL, LDLCALC, TRIG, CHOLHDL, LDLDIRECT in the last 72 hours. Thyroid Function Tests: No results for input(s): TSH, T4TOTAL, FREET4, T3FREE, THYROIDAB in the last 72 hours. Anemia Panel: No results for input(s): VITAMINB12, FOLATE, FERRITIN, TIBC, IRON, RETICCTPCT in the last 72 hours. Sepsis Labs: No results for input(s): PROCALCITON, LATICACIDVEN in the last 168 hours.  No results found for this or any previous visit (from the past 240 hours).       Radiology Studies: No results found.      Scheduled Meds:  atorvastatin   40 mg Oral Daily   Chlorhexidine  Gluconate Cloth  6  each Topical Daily   cyanocobalamin   2,500 mcg Oral Daily   insulin  aspart  0-15 Units Subcutaneous TID WC   latanoprost   1 drop Both Eyes QHS   metoprolol  tartrate  50 mg Oral BID   pantoprazole   40 mg Oral Daily   potassium chloride   20 mEq Oral Daily   sacubitril -valsartan   1 tablet Oral BID   Continuous Infusions:  heparin  1,250 Units/hr (09/26/23 1746)     LOS: 2 days    Time spent: 52 minutes spent on chart review, discussion with nursing staff, consultants, updating family and interview/physical exam; more than 50% of that time was spent in counseling and/or coordination of care.    James PARAS Cullen Lahaie, DO [facility] Available via Epic secure chat 7am-7pm After these hours, please refer to coverage provider listed on [email or web address] 09/27/2023, 11:18 AM

## 2023-09-27 NOTE — Plan of Care (Signed)
  Problem: Coping: Goal: Ability to adjust to condition or change in health will improve Outcome: Progressing   Problem: Fluid Volume: Goal: Ability to maintain a balanced intake and output will improve Outcome: Progressing   Problem: Health Behavior/Discharge Planning: Goal: Ability to identify and utilize available resources and services will improve Outcome: Progressing   Problem: Health Behavior/Discharge Planning: Goal: Ability to manage health-related needs will improve Outcome: Progressing   Problem: Metabolic: Goal: Ability to maintain appropriate glucose levels will improve Outcome: Progressing   Problem: Nutritional: Goal: Maintenance of adequate nutrition will improve Outcome: Progressing

## 2023-09-28 ENCOUNTER — Telehealth: Payer: Self-pay

## 2023-09-28 LAB — CBC
HCT: 28.9 % — ABNORMAL LOW (ref 39.0–52.0)
Hemoglobin: 9.4 g/dL — ABNORMAL LOW (ref 13.0–17.0)
MCH: 29.8 pg (ref 26.0–34.0)
MCHC: 32.5 g/dL (ref 30.0–36.0)
MCV: 91.7 fL (ref 80.0–100.0)
Platelets: 124 10*3/uL — ABNORMAL LOW (ref 150–400)
RBC: 3.15 MIL/uL — ABNORMAL LOW (ref 4.22–5.81)
RDW: 17 % — ABNORMAL HIGH (ref 11.5–15.5)
WBC: 7.6 10*3/uL (ref 4.0–10.5)
nRBC: 0 % (ref 0.0–0.2)

## 2023-09-28 LAB — GLUCOSE, CAPILLARY
Glucose-Capillary: 130 mg/dL — ABNORMAL HIGH (ref 70–99)
Glucose-Capillary: 177 mg/dL — ABNORMAL HIGH (ref 70–99)
Glucose-Capillary: 207 mg/dL — ABNORMAL HIGH (ref 70–99)

## 2023-09-28 LAB — HEPARIN LEVEL (UNFRACTIONATED): Heparin Unfractionated: 0.33 [IU]/mL (ref 0.30–0.70)

## 2023-09-28 NOTE — Telephone Encounter (Signed)
 Received called from pt's wife concerning how pt should resume his Warfarin/Lovenox  post hospitalization. Pt was admitted 09/24/2023 - 09/28/2023 due to Hematoma from recent surgery. Pt was on heparin  gtt during hospital admission; however there is no documentation on how pt should restart his Warfarin and Lovenox . Reviewed with Kristin Alvstad, PharmD to determine best option. Per Kristin, pt should resume Warfarin (normal dose) and Lovenox  BID. Scheduled coumadin  clinic appt in [place] on 10/03/23 to recheck INR. Per Kristin, Maybe can stop lovenox  when INR is around 2-2.5 d/t hematoma. Pt's wife verbalized understanding.

## 2023-09-28 NOTE — Discharge Summary (Signed)
 Physician Discharge Summary  Patient ID: James Paul MRN: [id number] DOB/AGE: [birth year] 76 y.o.  Admit date: 09/24/2023 Discharge date: 09/28/2023  Admission Diagnoses: Postop hematoma  Discharge Diagnoses:  Principal Problem:   Hematoma Active Problems:   Essential hypertension   Long term current use of anticoagulant therapy   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 mellitus (HCC)   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, 2018 and   Atrial fibrillation (HCC)   Hematoma of face   Hypokalemia   History of CVA (cerebrovascular accident)   Acute blood loss anemia   Left atrial thrombus   Controlled type 2 diabetes mellitus without complication, without long-term current use of insulin  (HCC)   Discharged Condition: good  Hospital Course: No complications  Consults: none  Significant Diagnostic Studies: none  Treatments: surgery: Evacuation of postop hematoma  Discharge Exam: Blood pressure (!) 142/100, pulse 95, temperature (!) 97.5 F (36.4 C), temperature source Oral, resp. rate 16, SpO2 100%. PHYSICAL EXAM: Drainage reduced significantly.  Drain removed today.  Incision intact.  Moderate ecchymosis but no active hematoma or bleeding.  Disposition: Discharge disposition: 01-Home or Self Care       Discharge Instructions     Diet - low sodium heart healthy   Complete by: As directed    Increase activity slowly   Complete by: As directed       Allergies as of 09/28/2023   No Known Allergies      Medication List     TAKE these medications    acetaminophen  500 MG tablet Commonly known as: TYLENOL  Take 1,500 mg by mouth every 8 (eight) hours as needed for moderate pain (pain score 4-6).   atorvastatin  40 MG tablet Commonly known as: LIPITOR Take 40 mg by mouth daily.   B-12 2500 MCG Tabs Take 2,500 mcg by mouth daily.   enoxaparin  100 MG/ML injection Commonly known as: LOVENOX  Inject 1 mL (100 mg  total) into the skin every 12 (twelve) hours.   Entresto  49-51 MG Generic drug: sacubitril -valsartan  Take 1 tablet by mouth 2 (two) times daily.   latanoprost  0.005 % ophthalmic solution Commonly known as: XALATAN  Place 1 drop into both eyes at bedtime.   metoprolol  tartrate 50 MG tablet Commonly known as: LOPRESSOR  Take 50 mg by mouth 2 (two) times daily.   pantoprazole  40 MG tablet Commonly known as: PROTONIX  Take 40 mg by mouth daily.   potassium chloride  10 MEQ tablet Commonly known as: KLOR-CON  Take 1 tablet (10 mEq total) by mouth as directed. Take 4 tabs (40 meq) today (10/16) and then 1 tab a day starting tomorrow (10/17) What changed:  how much to take when to take this additional instructions   sitaGLIPtin 25 MG tablet Commonly known as: JANUVIA Take 25 mg by mouth daily.   traMADol 50 MG tablet Commonly known as: ULTRAM Take 50 mg by mouth every 8 (eight) hours as needed for moderate pain (pain score 4-6).   warfarin 5 MG tablet Commonly known as: COUMADIN  Take as directed. If you are unsure how to take this medication, talk to your nurse or doctor. Original instructions: TAKE 1/2 TABLET TO 1 TABLET BY MOUTH DAILY AS DIRECTED BY COUMADIN  CLINIC What changed:  how much to take how to take this when to take this additional instructions        Follow-up Information     Carlie Clark, MD. Schedule an appointment as soon as possible for a visit in  1 week(s).   Specialty: Otolaryngology Contact information: [address] [place] [address] [phone]                 Signed: Ida Loader 09/28/2023, 12:11 PM

## 2023-09-28 NOTE — Discharge Instructions (Signed)
 Resume warfarin today or tomorrow.

## 2023-09-28 NOTE — Progress Notes (Signed)
 PROGRESS NOTE    RODRICKUS MIN  FMW:[id number] DOB: [birth year] DOA: 09/24/2023 PCP: Bonny Danelle DEL, DO    Brief Narrative:   James Paul is a 78 y.o. male with past medical history significant for hypertension, persistent atrial fibrillation on chronic anticoagulation, s/p mitral along with aortic valve replacement, CVA, left atrial thrombus, PAD, diabetes mellitus type 2, tobacco abuse, and prior left parotid mass s/p resection in 2021 presented due to right neck swelling after having  right parotidectomy performed by Dr. Carlie on 12/27 for a large right parotid mass.  The following day after the procedure patient had his JP drain removed and was ultimately discharged home and recommended to resume anticoagulation.  He reported resuming anticoagulation upon morning.  He had taken a nap that afternoon and when he woke up reported reported that his neck was half the size of a squash.  His swelling did not cause any breathing or swallowing difficulties. The patient expressed distress over the swelling and the associated discomfort, describing the experience as terrible.  Patient was noted to have a large hematoma and taken for emergent surgery by Dr. Jesus on 12/29 with new drain placement.   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 Carlie on 12/27.  Following procedure patient resumed anticoagulation and unfortunately developed significant neck swelling and presented back to the hospital for evaluation.  He was noted to have a large hematoma on the right side of his face and underwent emergent evacuation by ENT, Dr. Jesus with drain placement on 09/24/2023.  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 -- Continue to monitor drain output, 10 mL out reported past 24h; removed by ENT  today -- Outpatient follow-up with ENT   Acute blood loss anemia Hemoglobin 9.4 on admission, but had been 11.5 when checked prior to surgery on 12/20.  Thought secondary to surgery as well as postoperative hematoma that was evacuated postoperatively. -- Hgb  9.4>10.5>10.1>10.3>9.4 -- Transfuse for hemoglobin less than 8 g/dL -- Monitor drain output as above, 10 mL past 24 hours  -- CBC daily   Hypokalemia: Resolved Repleted at time of admission.     Persistent atrial fibrillation History of left atrial thrombus Chronic anticoagulation Patient had resumed Lovenox  and warfarin following his procedure prior to hematoma. INR goal is 3-3.5. CHA2DS2-VASc score equal to at least 6. -- Continue heparin  drip, discharging today with transition back to warfarin with Lovenox  bridge -- Outpatient follow-up with cardiology for further management   Type 2 diabetes mellitus. Home medication regimen includes Januvia 25 mg daily.  Hemoglobin A1c 5.5 on 09/15/2023, well-controlled. -- Hold home Januvia while inpatient -- Moderate SSI for coverage -- CBG before every meal/at bedtime   History of mitral and aortic valve replacement On warfarin outpatient with INR goal 3-3.5. -- Currently on heparin  drip as above, discharging home and transition back to warfarin   Essential hypertension -- Continue Entresto  and metoprolol    History of CVA -- Continue statin, beta-blocker -- On heparin  drip as above   Hyperlipidemia -- Continue atorvastatin   GERD -- Protonix  40 mg p.o. 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  Complicates all facets of care   DVT prophylaxis: SCDs Start: 09/25/23 0138    Code Status: Full Code Family Communication: No family present bedside  this morning  Disposition Plan:  Level of care: Med-Surg Status is: Inpatient Remains inpatient appropriate because: Heparin  drip, disposition per primary ENT      Subjective: Patient seen examined  bedside, resting calmly.  Bed.  Reports unable to tolerate CPAP overnight due to facemask compressing the right side of his face causing discomfort.  Seen by ENT this morning, drain removed and discharged home.   No other specific questions, concerns or complaints at this time.  Denies headache, no dizziness, no chest pain, no palpitations, no shortness of breath, no abdominal pain, no fever/chills/night sweats, no nausea/vomiting/diarrhea, no focal weakness, no fatigue, no paresthesia.  No acute events overnight per nursing.  Objective: Vitals:   09/27/23 2355 09/28/23 0430 09/28/23 0804 09/28/23 0902  BP: 114/70 131/85 (!) 142/100 (!) 142/100  Pulse: 77 90 95 95  Resp: 18 18 16    Temp: 97.6 F (36.4 C) (!) 97.5 F (36.4 C) (!) 97.5 F (36.4 C)   TempSrc: Oral Oral Oral   SpO2: 97% 98% 100%     Intake/Output Summary (Last 24 hours) at 09/28/2023 1230 Last data filed at 09/28/2023 1218 Gross per 24 hour  Intake 702.64 ml  Output 620 ml  Net 82.64 ml   There were no vitals filed for this visit.  Examination:  Physical Exam: GEN: NAD, alert and oriented x 3, obese HEENT: NCAT, PERRL, EOMI, sclera clear, MMM, + TTP, + edema and eccymosis right anterior right ear region; drain noted to right neck with small amount of blood w/in collection bulb. PULM: CTAB w/o wheezes/crackles, normal respiratory effort, on room air CV: RRR w/o M/G/R GI: abd soft, NTND, NABS, no R/G/M MSK: no peripheral edema, muscle strength globally intact 5/5 bilateral upper/lower extremities NEURO: CN II-XII intact, no focal deficits, sensation to light touch intact PSYCH: normal mood/affect Integumentary: dry/intact, no rashes or wounds    Data Reviewed: I have personally reviewed following labs and imaging studies  CBC: Recent Labs  Lab 09/24/23 2306 09/25/23 1837 09/26/23 0628 09/27/23 0825 09/28/23 0736  WBC 10.9*  --  7.3 7.7 7.6  NEUTROABS 9.7*  --   --   --   --   HGB 9.4* 10.5* 10.1* 10.3* 9.4*   HCT 29.3* 32.7* 30.9* 32.2* 28.9*  MCV 93.0  --  91.4 91.7 91.7  PLT 128*  --  PLATELET CLUMPS NOTED ON SMEAR, COUNT APPEARS DECREASED 119* 124*   Basic Metabolic Panel: Recent Labs  Lab 09/24/23 2306 09/25/23 1837 09/26/23 0840 09/27/23 0825  NA 140 140 136 137  K 2.4* 3.9 3.4* 4.0  CL 111 105 103 102  CO2 19* 26 24 26   GLUCOSE 218* 158* 201* 216*  BUN 7* 10 11 8   CREATININE 0.78 1.01 0.88 0.86  CALCIUM  6.9* 8.8* 8.3* 8.9  MG  --  1.3* 1.7 1.7   GFR: Estimated Creatinine Clearance: 83.1 mL/min (by C-G formula based on SCr of 0.86 mg/dL). Liver Function Tests: No results for input(s): AST, ALT, ALKPHOS, BILITOT, PROT, ALBUMIN in the last 168 hours. No results for input(s): LIPASE, AMYLASE in the last 168 hours. No results for input(s): AMMONIA in the last 168 hours. Coagulation Profile: Recent Labs  Lab 09/22/23 0621 09/24/23 2306  INR 1.3* 1.3*   Cardiac Enzymes: No results for input(s): CKTOTAL, CKMB, CKMBINDEX, TROPONINI in the last 168 hours. BNP (last 3 results) No results for input(s): PROBNP in the last 8760 hours. HbA1C: No results for input(s): HGBA1C in the last 72 hours. CBG: Recent  Labs  Lab 09/27/23 1151 09/27/23 1752 09/27/23 2104 09/28/23 0802 09/28/23 1151  GLUCAP 172* 172* 130* 177* 207*   Lipid Profile: No results for input(s): CHOL, HDL, LDLCALC, TRIG, CHOLHDL, LDLDIRECT in the last 72 hours. Thyroid Function Tests: No results for input(s): TSH, T4TOTAL, FREET4, T3FREE, THYROIDAB in the last 72 hours. Anemia Panel: No results for input(s): VITAMINB12, FOLATE, FERRITIN, TIBC, IRON, RETICCTPCT in the last 72 hours. Sepsis Labs: No results for input(s): PROCALCITON, LATICACIDVEN in the last 168 hours.  No results found for this or any previous visit (from the past 240 hours).       Radiology Studies: No results found.      Scheduled Meds:  atorvastatin   40 mg  Oral Daily   Chlorhexidine  Gluconate Cloth  6 each Topical Daily   cyanocobalamin   2,500 mcg Oral Daily   insulin  aspart  0-15 Units Subcutaneous TID WC   latanoprost   1 drop Both Eyes QHS   metoprolol  tartrate  50 mg Oral BID   pantoprazole   40 mg Oral Daily   potassium chloride   20 mEq Oral Daily   sacubitril -valsartan   1 tablet Oral BID   Continuous Infusions:     LOS: 3 days    Time spent: 52 minutes spent on chart review, discussion with nursing staff, consultants, updating family and interview/physical exam; more than 50% of that time was spent in counseling and/or coordination of care.    Camellia PARAS Wakeelah Solan, DO [facility] Available via Epic secure chat 7am-7pm After these hours, please refer to coverage provider listed on [email or web address] 09/28/2023, 12:30 PM

## 2023-09-28 NOTE — Progress Notes (Signed)
 Explained discharge instructions to patient. Reviewed follow up appointment and next medication administration times. Also reviewed education. Patient verbalized having an understanding for instructions given. All belongings are in the patient's possession to include TOC meds. IV and telemetry were removed by floor staff prior to my explaining discharge instructions. No other needs verbalized. Life message for patient's wife explaining he will be in the discharge lounge.

## 2023-09-28 NOTE — Progress Notes (Signed)
 PHARMACY - ANTICOAGULATION CONSULT NOTE  Pharmacy Consult for heparin  infusion Indication: AVR / MVR w/ history of atrial fibrillation   No Known Allergies  Patient Measurements:   Heparin  Dosing Weight: 88.4 kg  Vital Signs: Temp: 97.5 F (36.4 C) (01/02 0804) Temp Source: Oral (01/02 0804) BP: 142/100 (01/02 0902) Pulse Rate: 95 (01/02 0902)  Labs: Recent Labs    09/25/23 1837 09/25/23 1837 09/26/23 0628 09/26/23 0840 09/26/23 1345 09/27/23 0825 09/28/23 0736  HGB 10.5*  --  10.1*  --   --  10.3* 9.4*  HCT 32.7*  --  30.9*  --   --  32.2* 28.9*  PLT  --   --  PLATELET CLUMPS NOTED ON SMEAR, COUNT APPEARS DECREASED  --   --  119* 124*  HEPARINUNFRC  --    < > 0.43  --  0.47 0.46 0.33  CREATININE 1.01  --   --  0.88  --  0.86  --    < > = values in this interval not displayed.    Estimated Creatinine Clearance: 83.1 mL/min (by C-G formula based on SCr of 0.86 mg/dL).   Medical History: Past Medical History:  Diagnosis Date   Anemia, unspecified 03/06/2023   Aortic valve replaced 03/14/2023   Arthritis    Back   Atrial fibrillation (HCC) 03/06/2023   Cataract    B/L early cataracts   Cerebral infarction [facility]) 03/06/2023   Feb 01, 2023 Entered By: BONNY DANELLE DEL Comment: RUE weakness + facial droop-[facility],see jlv.     CHF (congestive heart failure) (HCC)    Cholesterol retinal embolus of 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   Cigarette smoker 07/14/2017   Compression fracture of vertebral column (HCC) 05/31/2023   Diabetes mellitus without complication (HCC)    Dysrhythmia    A. 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   Mitral valve stenosis 03/06/2023   Jun 24, 2020 Entered By: WOODROW ALEENE ORN Comment: s/p mech mitral valve in 2017 @ [facility]     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 mellitus (HCC) 03/06/2023   Nicotine dependence 03/06/2023   Obesity 03/06/2023   Obstructive sleep apnea (adult) (pediatric) 03/06/2023   Primary open-angle glaucoma, bilateral, 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   Retinal artery branch occlusion, 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   Retinal ischemia 03/06/2023   Seasonal allergies    Sleep apnea    Solitary pulmonary nodule 07/13/2017   CT at [facility] ins [place] req 07/13/2017   - Spirometry 07/13/2017  FEV1 1.57 (50%)  Ratio 81      Special screening for malignant neoplasms, colon 03/06/2023   Oct 08, 2013 Entered By: BONNY DANELLE DEL Comment: Normal colonoscopy 12-19-12-see gi consult     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 He has a PMH significant for AVR/MVR on lovenox  and warfarin. Last dose given was 09/24/2023 10:30 AM.   Heparin  level 0.33 is at low end of therapeutic on 1250 units/hr.  PTA Warfarin- 7.5mg  Thurs, 5mg  AOD. INR goal 3-3.5   Goal of Therapy:  Heparin  level 0.3-0.5 units/ml Monitor platelets by anticoagulation protocol: Yes   Plan:  Increase IV heparin  1300 units/hr. Daily heparin  level and CBC. Follow-up transition to warfarin, possibly 1/2   Jinnie Door, PharmD, [place], [facility] Clinical Pharmacist  Please check AMION for all [facility]  phone numbers After 10:00 PM, call Main Pharmacy [phone]

## 2023-09-28 NOTE — Plan of Care (Signed)
  Problem: Education: Goal: Ability to describe self-care measures that may prevent or decrease complications (Diabetes Survival Skills Education) will improve Outcome: Progressing Goal: Individualized Educational Video(s) Outcome: Progressing   Problem: Coping: Goal: Ability to adjust to condition or change in health will improve Outcome: Progressing   Problem: Fluid Volume: Goal: Ability to maintain a balanced intake and output will improve Outcome: Progressing   Problem: Health Behavior/Discharge Planning: Goal: Ability to identify and utilize available resources and services will improve Outcome: Progressing Goal: Ability to manage health-related needs will improve Outcome: Progressing   Problem: Metabolic: Goal: Ability to maintain appropriate glucose levels will improve Outcome: Progressing   Problem: Nutritional: Goal: Maintenance of adequate nutrition will improve Outcome: Progressing Goal: Progress toward achieving an optimal weight will improve Outcome: Progressing   Problem: Skin Integrity: Goal: Risk for impaired skin integrity will decrease Outcome: Progressing   Problem: Tissue Perfusion: Goal: Adequacy of tissue perfusion will improve Outcome: Progressing   Problem: Education: Goal: Knowledge of the prescribed therapeutic regimen will improve Outcome: Progressing   Problem: Activity: Goal: Ability to tolerate increased activity will improve Outcome: Progressing   Problem: Health Behavior/Discharge Planning: Goal: Identification of resources available to assist in meeting health care needs will improve Outcome: Progressing   Problem: Nutrition: Goal: Maintenance of adequate nutrition will improve Outcome: Progressing   Problem: Clinical Measurements: Goal: Complications related to the disease process, condition or treatment will be avoided or minimized Outcome: Progressing   Problem: Respiratory: Goal: Will regain and/or maintain adequate  ventilation Outcome: Progressing   Problem: Skin Integrity: Goal: Demonstration of wound healing without infection will improve Outcome: Progressing   Problem: Education: Goal: Knowledge of General Education information will improve Description: Including pain rating scale, medication(s)/side effects and non-pharmacologic comfort measures Outcome: Progressing   Problem: Health Behavior/Discharge Planning: Goal: Ability to manage health-related needs will improve Outcome: Progressing   Problem: Clinical Measurements: Goal: Ability to maintain clinical measurements within normal limits will improve Outcome: Progressing Goal: Will remain free from infection Outcome: Progressing Goal: Diagnostic test results will improve Outcome: Progressing Goal: Respiratory complications will improve Outcome: Progressing Goal: Cardiovascular complication will be avoided Outcome: Progressing   Problem: Activity: Goal: Risk for activity intolerance will decrease Outcome: Progressing   Problem: Nutrition: Goal: Adequate nutrition will be maintained Outcome: Progressing   Problem: Coping: Goal: Level of anxiety will decrease Outcome: Progressing   Problem: Elimination: Goal: Will not experience complications related to bowel motility Outcome: Progressing Goal: Will not experience complications related to urinary retention Outcome: Progressing   Problem: Pain Management: Goal: General experience of comfort will improve Outcome: Progressing   Problem: Safety: Goal: Ability to remain free from injury will improve Outcome: Progressing   Problem: Skin Integrity: Goal: Risk for impaired skin integrity will decrease Outcome: Progressing

## 2023-09-29 ENCOUNTER — Ambulatory Visit: Payer: Medicare PPO

## 2023-10-03 ENCOUNTER — Ambulatory Visit: Payer: HMO | Attending: Cardiology

## 2023-10-03 DIAGNOSIS — Z952 Presence of prosthetic heart valve: Secondary | ICD-10-CM

## 2023-10-03 LAB — POCT INR: INR: 1.2 — AB (ref 2.0–3.0)

## 2023-10-03 MED ORDER — ENOXAPARIN SODIUM 100 MG/ML IJ SOSY: 100.0000 mg | PREFILLED_SYRINGE | Freq: Two times a day (BID) | INTRAMUSCULAR | 1 refills | Status: DC

## 2023-10-03 NOTE — Patient Instructions (Signed)
 Description   Continue Lovenox  injections until Friday. Take 2 tablets today and 2 tablets tomorrow and then continue taking 1 tablet daily EXCEPT 1/2 tablet on Thursdays.  Stay consistent with greens each week (2 servings per week)  Recheck INR in 1 week.  Coumadin  Clinic [phone] Cardiac Clearance Fax #[phone]

## 2023-10-05 ENCOUNTER — Encounter: Payer: Self-pay | Admitting: *Deleted

## 2023-10-05 DIAGNOSIS — I5032 Chronic diastolic (congestive) heart failure: Secondary | ICD-10-CM

## 2023-10-05 HISTORY — DX: Chronic diastolic (congestive) heart failure: I50.32

## 2023-10-10 ENCOUNTER — Ambulatory Visit: Payer: Non-veteran care

## 2023-10-10 DIAGNOSIS — H6123 Impacted cerumen, bilateral: Secondary | ICD-10-CM | POA: Insufficient documentation

## 2023-10-10 HISTORY — DX: Impacted cerumen, bilateral: H61.23

## 2023-10-11 ENCOUNTER — Telehealth: Payer: Self-pay | Admitting: Cardiology

## 2023-10-11 MED ORDER — ENTRESTO 49-51 MG PO TABS: 1.0000 | ORAL_TABLET | Freq: Two times a day (BID) | ORAL | 10 refills | Status: DC

## 2023-10-11 NOTE — Telephone Encounter (Signed)
*  STAT* If patient is at the pharmacy, call can be transferred to refill team.   1. Which medications need to be refilled? (please list name of each medication and dose if known) new prescription fro Entresto - changing pharmacy   2. Would you like to learn more about the convenience, safety, & potential cost savings by using the [facility]?    3. Are you open to using the [facility] (Type [facility].    4. Which pharmacy/location (including street and city if local pharmacy) is [facility] to be sent to?[facility] [address] [place]   5. Do they need a 30 day or 90 day supply? 30 days and refills

## 2023-10-11 NOTE — Telephone Encounter (Signed)
 Refill of Entresto  49-51 mg [facility] to [facility] [place] [place].

## 2023-10-17 ENCOUNTER — Ambulatory Visit: Payer: HMO | Attending: Cardiology

## 2023-10-17 ENCOUNTER — Other Ambulatory Visit: Payer: Self-pay

## 2023-10-17 DIAGNOSIS — Z5181 Encounter for therapeutic drug level monitoring: Secondary | ICD-10-CM

## 2023-10-17 DIAGNOSIS — Z952 Presence of prosthetic heart valve: Secondary | ICD-10-CM

## 2023-10-17 LAB — POCT INR: INR: 2.1 (ref 2.0–3.0)

## 2023-10-17 MED ORDER — WARFARIN SODIUM 5 MG PO TABS: ORAL_TABLET | ORAL | 0 refills | Status: DC

## 2023-10-17 NOTE — Patient Instructions (Addendum)
Description   Take 2 tablets today and then START taking 1 tablet daily.  Stay consistent with greens each week (2 servings per week)  Recheck INR in 1 week.  Coumadin Clinic [phone] Cardiac Clearance Fax #[phone]

## 2023-10-17 NOTE — Telephone Encounter (Signed)
Prescription refill request received for warfarin Lov: 08/30/23 Bing Matter)  Next INR check: 10/24/23 Warfarin tablet strength: 5mg   Appropriate dose. Refill sent.

## 2023-10-24 ENCOUNTER — Ambulatory Visit: Payer: Non-veteran care | Attending: Cardiology

## 2023-10-24 DIAGNOSIS — Z952 Presence of prosthetic heart valve: Secondary | ICD-10-CM

## 2023-10-24 LAB — POCT INR: INR: 3.1 — AB (ref 2.0–3.0)

## 2023-10-24 NOTE — Patient Instructions (Signed)
Description   Continue taking 1 tablet daily.  Stay consistent with greens each week (1 servings per week)  Recheck INR in 2 weeks.  Coumadin Clinic [phone] Cardiac Clearance Fax #[phone]

## 2023-11-07 ENCOUNTER — Ambulatory Visit: Payer: PPO | Attending: Cardiology

## 2023-11-07 DIAGNOSIS — Z952 Presence of prosthetic heart valve: Secondary | ICD-10-CM | POA: Diagnosis not present

## 2023-11-07 LAB — POCT INR: INR: 2 (ref 2.0–3.0)

## 2023-11-07 NOTE — Patient Instructions (Signed)
Description   Tale 2 tablets today and then START taking 1 tablet daily except 1.5 tablets on Sundays.  Stay consistent with greens each week (1 servings per week)  Recheck INR in 2 weeks.  Coumadin Clinic [phone] Cardiac Clearance Fax #[phone]

## 2023-11-17 ENCOUNTER — Other Ambulatory Visit: Payer: Self-pay | Admitting: Cardiology

## 2023-11-17 NOTE — Telephone Encounter (Signed)
 Pt is requesting a refill on atorvastatin 40 mg tablet. This medication has not been refilled since 2015 and this medication was prescribed in the hospital. Dr. Bing Matter did not prescribe this medication. Would Dr. Bing Matter like to refill this medication? Please address

## 2023-11-17 NOTE — Telephone Encounter (Signed)
*  STAT* If patient is at the pharmacy, call can be transferred to refill team.   1. Which medications need to be refilled? (please list name of each medication and dose if known) atorvastatin (LIPITOR) 40 MG tablet  2. Which pharmacy/location (including street and city if local pharmacy) is medication to be sent to? [id number] [place] [place] - [address]  3. Do they need a 30 day or 90 day supply?   90 day supply

## 2023-11-21 ENCOUNTER — Ambulatory Visit: Payer: PPO | Attending: Cardiology

## 2023-11-21 DIAGNOSIS — Z952 Presence of prosthetic heart valve: Secondary | ICD-10-CM | POA: Diagnosis not present

## 2023-11-21 LAB — POCT INR: INR: 5.1 — AB (ref 2.0–3.0)

## 2023-11-21 NOTE — Patient Instructions (Signed)
 Description   Eat a serving of greens today and HOLD today's dose and then START taking 1 tablet daily.   Stay consistent with greens each week (1 servings per week)  Recheck INR in 2 weeks.  Coumadin Clinic [phone] Cardiac Clearance Fax #[phone]

## 2023-11-28 ENCOUNTER — Other Ambulatory Visit: Payer: Self-pay

## 2023-11-28 ENCOUNTER — Ambulatory Visit: Payer: PPO | Attending: Cardiology

## 2023-11-28 DIAGNOSIS — Z952 Presence of prosthetic heart valve: Secondary | ICD-10-CM

## 2023-11-28 LAB — POCT INR: INR: 2.5 (ref 2.0–3.0)

## 2023-11-28 MED ORDER — ATORVASTATIN CALCIUM 40 MG PO TABS: 40.0000 mg | ORAL_TABLET | Freq: Every day | ORAL | 3 refills | Status: DC

## 2023-11-28 NOTE — Telephone Encounter (Signed)
 Atorvastatin 40mg  #90 ref x 3 refilled sent to [facility]

## 2023-11-28 NOTE — Patient Instructions (Signed)
 Description   Take 1.5 tablets today and then resume taking 1 tablet daily.   Stay consistent with greens each week (1 servings per week)  Recheck INR in 2 weeks.  Coumadin Clinic [phone] Cardiac Clearance Fax #[phone]

## 2023-12-11 DIAGNOSIS — H40013 Open angle with borderline findings, low risk, bilateral: Secondary | ICD-10-CM | POA: Insufficient documentation

## 2023-12-11 HISTORY — DX: Open angle with borderline findings, low risk, bilateral: H40.013

## 2023-12-12 ENCOUNTER — Ambulatory Visit: Attending: Cardiology

## 2023-12-12 DIAGNOSIS — Z952 Presence of prosthetic heart valve: Secondary | ICD-10-CM | POA: Diagnosis not present

## 2023-12-12 LAB — POCT INR: INR: 3.5 — AB (ref 2.0–3.0)

## 2023-12-12 NOTE — Patient Instructions (Signed)
 Description   Continue taking 1 tablet daily.   Stay consistent with greens each week (1 servings per week)  Recheck INR in 3 weeks.  Coumadin Clinic [phone] Cardiac Clearance Fax #[phone]

## 2023-12-13 ENCOUNTER — Ambulatory Visit: Payer: Medicare PPO | Attending: Cardiology | Admitting: Cardiology

## 2023-12-13 ENCOUNTER — Other Ambulatory Visit: Payer: Self-pay | Admitting: Cardiology

## 2023-12-13 ENCOUNTER — Encounter: Payer: Self-pay | Admitting: Cardiology

## 2023-12-13 VITALS — BP 110/72 | HR 71 | Ht 68.0 in | Wt 217.4 lb

## 2023-12-13 DIAGNOSIS — I4819 Other persistent atrial fibrillation: Secondary | ICD-10-CM | POA: Diagnosis not present

## 2023-12-13 DIAGNOSIS — G4733 Obstructive sleep apnea (adult) (pediatric): Secondary | ICD-10-CM | POA: Diagnosis not present

## 2023-12-13 DIAGNOSIS — I10 Essential (primary) hypertension: Secondary | ICD-10-CM

## 2023-12-13 DIAGNOSIS — Z8673 Personal history of transient ischemic attack (TIA), and cerebral infarction without residual deficits: Secondary | ICD-10-CM | POA: Diagnosis not present

## 2023-12-13 DIAGNOSIS — Z952 Presence of prosthetic heart valve: Secondary | ICD-10-CM

## 2023-12-13 MED ORDER — POTASSIUM CHLORIDE ER 10 MEQ PO TBCR: 10.0000 meq | EXTENDED_RELEASE_TABLET | ORAL | 3 refills | Status: DC

## 2023-12-13 NOTE — Patient Instructions (Signed)
 Medication Instructions:  Your physician recommends that you continue on your current medications as directed. Please refer to the Current Medication list given to you today.  *If you need a refill on your cardiac medications before your next appointment, please call your pharmacy*   Lab Work: Your physician recommends that you return for lab work in: 1 month You need to have labs done when you are fasting.  You can come Monday through Friday 8:30 am to 12:00 pm and 1:15 to 4:30. You do not need to make an appointment as the order has already been placed. The labs you are going to have done are BMET    Testing/Procedures: None Ordered   Follow-Up: At [facility], you and your health needs are our priority.  As part of our continuing mission to provide you with exceptional heart care, we have created designated Provider Care Teams.  These Care Teams include your primary Cardiologist (physician) and Advanced Practice Providers (APPs -  Physician Assistants and Nurse Practitioners) who all work together to provide you with the care you need, when you need it.  We recommend signing up for the patient portal called "MyChart".  Sign up information is provided on this After Visit Summary.  MyChart is used to connect with patients for Virtual Visits (Telemedicine).  Patients are able to view lab/test results, encounter notes, upcoming appointments, etc.  Non-urgent messages can be sent to your provider as well.   To learn more about what you can do with MyChart, go to [email or web address].    Your next appointment:   5 month(s)  The format for your next appointment:   In Person  Provider:   Gypsy Balsam, MD    Other Instructions NA

## 2023-12-13 NOTE — Progress Notes (Signed)
 Cardiology Office Note:    Date:  12/13/2023   ID:  James Paul, DOB 02-21-1946, MRN [id number]  PCP:  Crisoforo Oxford, DO  Cardiologist:  Gypsy Balsam, MD    Referring MD: Crisoforo Oxford, DO   Chief Complaint  Patient presents with   Medication Refill    potassium    History of Present Illness:    James Paul is a 78 y.o. male   with past medical history significant for mitral valve and aortic valve replacement he does have surgery with prosthesis 23 mm in aortic position and 25 mm of mitral position. That was done in 2019 and [place] in [place]. Additional problem include Coumadin anticoagulation, essential hypertension, hyperlipidemia, peripheral vascular disease, smoking, recent hospital with CVA, it was felt to be related to have fluctuation of INR. Since that time he has been follow-up in my clinic.  Comes today to months for follow-up overall cardiac wise doing well.  He did have his surgery done was complicated by bleeding that required hospitalization and draining of the hematoma.  But then he spiked denies have any chest pain tightness squeezing pressure burning chest, he goes to physical therapy on the regular basis doing well.  Past Medical History:  Diagnosis Date   Anemia, unspecified 03/06/2023   Aortic valve replaced 03/14/2023   Arthritis    Back   Atrial fibrillation (HCC) 03/06/2023   Cataract    B/L early cataracts   Cerebral infarction [facility]) 03/06/2023   Feb 01, 2023 Entered By: Crisoforo Oxford Comment: RUE weakness + facial droop-[facility],see jlv.     CHF (congestive heart failure) (HCC)    Cholesterol retinal embolus of 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   Cigarette smoker 07/14/2017   Compression fracture of vertebral column (HCC) 05/31/2023   Diabetes mellitus without complication (HCC)    Dysrhythmia    A. 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   Mitral valve stenosis 03/06/2023   Jun 24, 2020 Entered By: Sheryn Bison Comment: s/p mech mitral valve in 2017 @ [facility]     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 mellitus (HCC) 03/06/2023   Nicotine dependence 03/06/2023   Obesity 03/06/2023   Obstructive sleep apnea (adult) (pediatric) 03/06/2023   Primary open-angle glaucoma, bilateral, 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   Retinal artery branch occlusion, 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   Retinal ischemia 03/06/2023   Seasonal allergies    Sleep apnea    Solitary pulmonary nodule 07/13/2017   CT at [facility] ins [place] req 07/13/2017   - Spirometry 07/13/2017  FEV1 1.57 (50%)  Ratio 81      Special screening for malignant neoplasms, colon 03/06/2023   Oct 08, 2013 Entered By: Crisoforo Oxford Comment: Normal colonoscopy 12-19-12-see gi consult     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, 2018 and 03/06/2023   Stroke (HCC) 01/06/2023   weakness right arm   Tobacco use disorder 03/06/2023  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 without macular edema, unspecified eye (HCC) 03/06/2023   Wears glasses     Past Surgical History:  Procedure Laterality Date   BACK SURGERY  1984   Lumbar Spine   CARDIAC VALVE REPLACEMENT  2018   Aortic and Mitral Valve Replacement   COLONOSCOPY  2013   HEMATOMA EVACUATION Right 09/24/2023   Procedure: EVACUATION RIGHT NECK HEMATOMA;  Surgeon: Serena Colonel, MD;  Location: [facility] OR;  Service: ENT;  Laterality: Right;   INGUINAL HERNIA REPAIR Left     x2   PAROTIDECTOMY Left 08/08/2014   Procedure: PAROTIDECTOMY;  Surgeon: Suzanna Obey, MD;  Location: [facility] OR;  Service: ENT;  Laterality: Left;   PAROTIDECTOMY Right 09/22/2023   Procedure: RIGHT PAROTIDECTOMY;  Surgeon: Christia Reading, MD;  Location: [facility] OR;  Service: ENT;  Laterality: Right;   TRANSESOPHAGEAL ECHOCARDIOGRAM (CATH LAB) N/A 07/14/2023   Procedure: TRANSESOPHAGEAL ECHOCARDIOGRAM;  Surgeon: Sande Rives, MD;  Location: [facility] INVASIVE CV LAB;  Service: Cardiovascular;  Laterality: N/A;    Current Medications: Current Meds  Medication Sig   acetaminophen (TYLENOL) 500 MG tablet Take 1,500 mg by mouth every 8 (eight) hours as needed for moderate pain (pain score 4-6).   atorvastatin (LIPITOR) 40 MG tablet Take 1 tablet (40 mg total) by mouth daily.   Cyanocobalamin (B-12) 2500 MCG TABS Take 2,500 mcg by mouth daily.   enoxaparin (LOVENOX) 100 MG/ML injection Inject 1 mL (100 mg total) into the skin every 12 (twelve) hours.   latanoprost (XALATAN) 0.005 % ophthalmic solution Place 1 drop into both eyes at bedtime.   metoprolol tartrate (LOPRESSOR) 50 MG tablet Take 50 mg by mouth 2 (two) times daily.   pantoprazole (PROTONIX) 40 MG tablet Take 40 mg by mouth daily.   potassium chloride (KLOR-CON) 10 MEQ tablet Take 1 tablet (10 mEq total) by mouth as directed. Take 4 tabs (40 meq) today (10/16) and then 1 tab a day starting tomorrow (10/17) (Patient taking differently: Take 20 mEq by mouth daily.)   sacubitril-valsartan (ENTRESTO) 49-51 MG Take 1 tablet by mouth 2 (two) times daily.   sitaGLIPtin (JANUVIA) 25 MG tablet Take 25 mg by mouth daily.   traMADol (ULTRAM) 50 MG tablet Take 50 mg by mouth every 8 (eight) hours as needed for moderate pain (pain score 4-6).   warfarin (COUMADIN) 5 MG tablet TAKE 1 TABLET BY MOUTH DAILY OR AS DIRECTED BY COUMADIN CLINIC (Patient taking differently: Take 5 mg by mouth See admin instructions. TAKE 1 TABLET BY MOUTH DAILY OR AS DIRECTED BY  COUMADIN CLINIC)     Allergies:   Patient has no known allergies.   Social History   Socioeconomic History   Marital status: Married    Spouse name: Not on file   Number of children: 1   Years of education: Not on file   Highest education level: Not on file  Occupational History   Not on file  Tobacco Use   Smoking status: Former    Average packs/day: 0.5 packs/day for 56.2 years (28.1 ttl pk-yrs)    Types: Cigarettes    Start date: 1968   Smokeless tobacco: Never  Vaping Use   Vaping status: Never Used  Substance and Sexual Activity   Alcohol use: Not Currently    Comment: Rare beer   Drug use: No   Sexual activity: Yes  Other Topics Concern   Not on file  Social History Narrative   Not on file  Social Drivers of Corporate investment banker Strain: Not on file  Food Insecurity: No Food Insecurity (09/25/2023)   Hunger Vital Sign    Worried About Running Out of Food in the Last Year: Never true    Ran Out of Food in the Last Year: Never true  Transportation Needs: No Transportation Needs (09/25/2023)   PRAPARE - Administrator, Civil Service (Medical): No    Lack of Transportation (Non-Medical): No  Physical Activity: Not on file  Stress: Not on file  Social Connections: Moderately Integrated (09/25/2023)   Social Connection and Isolation Panel [NHANES]    Frequency of Communication with Friends and Family: Three times a week    Frequency of Social Gatherings with Friends and Family: Once a week    Attends Religious Services: 1 to 4 times per year    Active Member of [employer] or Organizations: No    Attends Engineer, structural: Never    Marital Status: Married     Family History: The patient's family history includes Aneurysm in his father; Congestive Heart Failure in his mother and sister. ROS:   Please see the history of present illness.    All 14 point review of systems negative except as described per history of present  illness  EKGs/Labs/Other Studies Reviewed:         Recent Labs: 09/27/2023: BUN 8; Creatinine, Ser 0.86; Magnesium 1.7; Potassium 4.0; Sodium 137 09/28/2023: Hemoglobin 9.4; Platelets 124  Recent Lipid Panel No results found for: "CHOL", "TRIG", "HDL", "CHOLHDL", "VLDL", "LDLCALC", "LDLDIRECT"  Physical Exam:    VS:  BP 110/72 (BP Location: Right Arm, Patient Position: Sitting)   Pulse 71   Ht 5\' 8"  (1.727 m)   Wt 217 lb 6.4 oz (98.6 kg)   SpO2 93%   BMI 33.06 kg/m     Wt Readings from Last 3 Encounters:  12/13/23 217 lb 6.4 oz (98.6 kg)  09/22/23 210 lb (95.3 kg)  09/15/23 214 lb (97.1 kg)     GEN:  Well nourished, well developed in no acute distress HEENT: Normal NECK: No JVD; No carotid bruits LYMPHATICS: No lymphadenopathy CARDIAC: Irregularly irregular, valve sounds crisp, no murmurs, no rubs, no gallops RESPIRATORY:  Clear to auscultation without rales, wheezing or rhonchi  ABDOMEN: Soft, non-tender, non-distended MUSCULOSKELETAL:  No edema; No deformity  SKIN: Warm and dry LOWER EXTREMITIES: no swelling NEUROLOGIC:  Alert and oriented x 3 PSYCHIATRIC:  Normal affect   ASSESSMENT:    1. Persistent atrial fibrillation (HCC)   2. Essential hypertension   3. Obstructive sleep apnea (adult) (pediatric)   4. History of CVA (cerebrovascular accident)   5. H/O mitral valve replacement 25 mm changes done at [facility] 2018   6. 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  He is not interested in cardioversion.  He is doing well overall clinically which he is severely enlarged atrium I doubt will be succeeding.  Continue with anticoagulation with Coumadin. Essential hypertension blood pressure well-controlled continue present management. History of CVA issues. Mitral valve aortic valve replacement doing well from that  point review done in 2019 continue present management.    Medication Adjustments/Labs and Tests Ordered: Current medicines are reviewed at length with the patient today.  Concerns regarding medicines are outlined above.  No orders of the defined  types were placed in this encounter.  Medication changes: No orders of the defined types were placed in this encounter.   Signed, Georgeanna Lea, MD, [facility] 12/13/2023 4:35 PM    [facility]

## 2023-12-13 NOTE — Addendum Note (Signed)
 Addended by: Baldo Ash D on: 12/13/2023 04:53 PM   Modules accepted: Orders

## 2023-12-14 ENCOUNTER — Telehealth: Payer: Self-pay

## 2023-12-14 DIAGNOSIS — R42 Dizziness and giddiness: Secondary | ICD-10-CM

## 2023-12-14 NOTE — Telephone Encounter (Signed)
 Pt was At Urgent care with Dizziness and Vomiting. Per Dr. Bing Matter- carotid US and Zio x 2 weeks.

## 2023-12-14 NOTE — Telephone Encounter (Signed)
 James Paul

## 2023-12-14 NOTE — Telephone Encounter (Signed)
 Spoke with [facility], corrections made to 1 tab every day per RJK.

## 2023-12-19 ENCOUNTER — Ambulatory Visit: Attending: Cardiology

## 2023-12-19 DIAGNOSIS — R42 Dizziness and giddiness: Secondary | ICD-10-CM

## 2024-01-02 ENCOUNTER — Ambulatory Visit (INDEPENDENT_AMBULATORY_CARE_PROVIDER_SITE_OTHER)

## 2024-01-02 ENCOUNTER — Ambulatory Visit: Attending: Cardiology

## 2024-01-02 DIAGNOSIS — Z952 Presence of prosthetic heart valve: Secondary | ICD-10-CM

## 2024-01-02 DIAGNOSIS — R42 Dizziness and giddiness: Secondary | ICD-10-CM

## 2024-01-02 LAB — POCT INR: INR: 4.6 — AB (ref 2.0–3.0)

## 2024-01-02 NOTE — Patient Instructions (Signed)
 Description   HOLD today's dose and then START taking 1 tablet daily except 1/2 tablet on Sundays.   Stay consistent with greens each week (1 servings per week)  Recheck INR in 2 weeks.  Coumadin Clinic [phone] Cardiac Clearance Fax #[phone]

## 2024-01-05 ENCOUNTER — Telehealth: Payer: Self-pay

## 2024-01-05 NOTE — Telephone Encounter (Signed)
Carotid US Results reviewed with pt as per Dr. Krasowski's note.  Pt verbalized understanding and had no additional questions. Routed to PCP  

## 2024-01-15 ENCOUNTER — Other Ambulatory Visit: Payer: Self-pay | Admitting: Cardiology

## 2024-01-15 DIAGNOSIS — Z952 Presence of prosthetic heart valve: Secondary | ICD-10-CM

## 2024-01-15 DIAGNOSIS — Z5181 Encounter for therapeutic drug level monitoring: Secondary | ICD-10-CM

## 2024-01-16 ENCOUNTER — Ambulatory Visit: Attending: Cardiology

## 2024-01-16 DIAGNOSIS — Z952 Presence of prosthetic heart valve: Secondary | ICD-10-CM | POA: Diagnosis not present

## 2024-01-16 LAB — POCT INR: INR: 3.5 — AB (ref 2.0–3.0)

## 2024-01-16 NOTE — Patient Instructions (Signed)
 Description   Continue taking 1 tablet daily except 1/2 tablet on Sundays.   Stay consistent with greens each week (1 servings per week)  Recheck INR in 3 weeks.  Coumadin  Clinic [phone] Cardiac Clearance Fax #[phone]

## 2024-01-22 DIAGNOSIS — R42 Dizziness and giddiness: Secondary | ICD-10-CM | POA: Diagnosis not present

## 2024-01-23 ENCOUNTER — Telehealth: Payer: Self-pay

## 2024-01-23 NOTE — Telephone Encounter (Signed)
Left message on My Chart with monitor results per Dr. Vanetta Shawl note. Routed to PCP.

## 2024-01-24 ENCOUNTER — Telehealth: Payer: Self-pay

## 2024-01-24 NOTE — Telephone Encounter (Signed)
Spoke with Erskine Squibb, notified of results

## 2024-01-24 NOTE — Telephone Encounter (Signed)
-----   Message from Ralene Burger sent at 01/23/2024  8:41 AM EDT ----- Monitor show atrial fibrillation with controlled ventricular rate.  Continue present management

## 2024-02-06 ENCOUNTER — Ambulatory Visit: Attending: Cardiology

## 2024-02-06 DIAGNOSIS — Z952 Presence of prosthetic heart valve: Secondary | ICD-10-CM

## 2024-02-06 LAB — POCT INR: INR: 2.9 (ref 2.0–3.0)

## 2024-02-06 NOTE — Patient Instructions (Signed)
 Description   Take 1.5 tablets today and then Continue taking 1 tablet daily except 1/2 tablet on Sundays.   Stay consistent with greens each week (1 servings per week)  Recheck INR in 3 weeks.  Coumadin  Clinic [phone] Cardiac Clearance Fax #[phone]

## 2024-02-17 ENCOUNTER — Other Ambulatory Visit: Payer: Self-pay | Admitting: Cardiology

## 2024-02-17 DIAGNOSIS — Z5181 Encounter for therapeutic drug level monitoring: Secondary | ICD-10-CM

## 2024-02-17 DIAGNOSIS — Z952 Presence of prosthetic heart valve: Secondary | ICD-10-CM

## 2024-02-20 NOTE — Telephone Encounter (Signed)
 Refill request for warfarin:  Last INR was 2.9 on 02/06/24 Next INR due 02/27/24 LOV was 12/13/23  Refill approved.

## 2024-02-27 ENCOUNTER — Ambulatory Visit: Attending: Cardiology

## 2024-02-27 DIAGNOSIS — Z952 Presence of prosthetic heart valve: Secondary | ICD-10-CM | POA: Diagnosis not present

## 2024-02-27 LAB — POCT INR: INR: 3.6 — AB (ref 2.0–3.0)

## 2024-02-27 NOTE — Patient Instructions (Signed)
 Description   Eat greens today and continue taking 1 tablet daily except 1/2 tablet on Sundays.   Stay consistent with greens each week (1 servings per week)  Recheck INR in 3 weeks.  Coumadin  Clinic [phone] Cardiac Clearance Fax #[phone]

## 2024-03-04 ENCOUNTER — Telehealth: Payer: Self-pay | Admitting: *Deleted

## 2024-03-04 NOTE — Telephone Encounter (Signed)
 Received a message to call the pt's wife regarding changing the time from 2pm on 03/19/24 to a later time. Spoke with wife and offered 330pm-she confirmed and was thankful for the change.

## 2024-03-19 ENCOUNTER — Ambulatory Visit: Attending: Cardiology

## 2024-03-19 ENCOUNTER — Encounter

## 2024-03-19 DIAGNOSIS — Z952 Presence of prosthetic heart valve: Secondary | ICD-10-CM | POA: Diagnosis not present

## 2024-03-19 LAB — POCT INR: INR: 2.5 (ref 2.0–3.0)

## 2024-03-19 NOTE — Patient Instructions (Signed)
 Description   Take 1.5 tablets today and continue taking 1 tablet daily except 1/2 tablet on Sundays.   Stay consistent with greens each week (1 servings per week)  Recheck INR in 3 weeks.  Coumadin  Clinic [phone] Cardiac Clearance Fax #[phone]

## 2024-03-20 NOTE — Progress Notes (Signed)
Please see anticoagulation encounter.

## 2024-04-09 ENCOUNTER — Ambulatory Visit: Attending: Cardiology

## 2024-04-09 DIAGNOSIS — Z952 Presence of prosthetic heart valve: Secondary | ICD-10-CM

## 2024-04-09 LAB — POCT INR: INR: 3.4 — AB (ref 2.0–3.0)

## 2024-04-09 NOTE — Progress Notes (Signed)
Please see anticoagulation encounter.

## 2024-04-09 NOTE — Patient Instructions (Addendum)
 Description   Continue taking 1 tablet daily except 1/2 tablet on Sundays.   Stay consistent with greens each week (1 servings per week)  Recheck INR in 5 weeks.  Coumadin  Clinic [phone] Cardiac Clearance Fax #[phone]

## 2024-05-10 DIAGNOSIS — K219 Gastro-esophageal reflux disease without esophagitis: Secondary | ICD-10-CM | POA: Insufficient documentation

## 2024-05-10 DIAGNOSIS — R011 Cardiac murmur, unspecified: Secondary | ICD-10-CM | POA: Insufficient documentation

## 2024-05-10 DIAGNOSIS — I499 Cardiac arrhythmia, unspecified: Secondary | ICD-10-CM | POA: Insufficient documentation

## 2024-05-10 DIAGNOSIS — E119 Type 2 diabetes mellitus without complications: Secondary | ICD-10-CM | POA: Insufficient documentation

## 2024-05-10 DIAGNOSIS — H269 Unspecified cataract: Secondary | ICD-10-CM | POA: Insufficient documentation

## 2024-05-10 DIAGNOSIS — J302 Other seasonal allergic rhinitis: Secondary | ICD-10-CM | POA: Insufficient documentation

## 2024-05-10 DIAGNOSIS — Z720 Tobacco use: Secondary | ICD-10-CM | POA: Insufficient documentation

## 2024-05-10 DIAGNOSIS — M1990 Unspecified osteoarthritis, unspecified site: Secondary | ICD-10-CM | POA: Insufficient documentation

## 2024-05-10 DIAGNOSIS — E7800 Pure hypercholesterolemia, unspecified: Secondary | ICD-10-CM | POA: Insufficient documentation

## 2024-05-10 DIAGNOSIS — I509 Heart failure, unspecified: Secondary | ICD-10-CM | POA: Insufficient documentation

## 2024-05-10 DIAGNOSIS — Z973 Presence of spectacles and contact lenses: Secondary | ICD-10-CM | POA: Insufficient documentation

## 2024-05-14 ENCOUNTER — Ambulatory Visit: Attending: Cardiology | Admitting: Cardiology

## 2024-05-14 ENCOUNTER — Ambulatory Visit (INDEPENDENT_AMBULATORY_CARE_PROVIDER_SITE_OTHER)

## 2024-05-14 ENCOUNTER — Encounter: Payer: Self-pay | Admitting: Cardiology

## 2024-05-14 VITALS — BP 110/64 | HR 68 | Ht 69.0 in | Wt 225.8 lb

## 2024-05-14 DIAGNOSIS — I4819 Other persistent atrial fibrillation: Secondary | ICD-10-CM

## 2024-05-14 DIAGNOSIS — Z952 Presence of prosthetic heart valve: Secondary | ICD-10-CM

## 2024-05-14 DIAGNOSIS — Z8673 Personal history of transient ischemic attack (TIA), and cerebral infarction without residual deficits: Secondary | ICD-10-CM | POA: Diagnosis not present

## 2024-05-14 DIAGNOSIS — E7800 Pure hypercholesterolemia, unspecified: Secondary | ICD-10-CM

## 2024-05-14 LAB — POCT INR: INR: 3.1 — AB (ref 2.0–3.0)

## 2024-05-14 MED ORDER — METOPROLOL TARTRATE 50 MG PO TABS: 50.0000 mg | ORAL_TABLET | Freq: Two times a day (BID) | ORAL | 3 refills | Status: AC

## 2024-05-14 NOTE — Progress Notes (Signed)
 INR 3.1  Please see anticoagulation encounter

## 2024-05-14 NOTE — Progress Notes (Unsigned)
 Cardiology Office Note:    Date:  05/14/2024   ID:  James Paul, DOB [birth year], MRN [id number]  PCP:  Bonny Danelle DEL, DO  Cardiologist:  Lamar Fitch, MD    Referring MD: Bonny Danelle DEL, DO   No chief complaint on file.   History of Present Illness:    James Paul is a 78 y.o. 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  Also history of CVA which felt to be related to high fluctuation of INR while he was taking care of by [facility], also attempted cardioversion a year ago TEE has been done however TEE revealed presence of atrial thrombus.  So cardioversion has not been done.  He ejection fraction 50 to 55% he is coming today to my office for follow-up.  Overall he complained of being weak tired exhausted and his wife complained that he is sitting on the chair and does not do much.  Denies have any chest pain tightness squeezing pressure mid chest.  Additional problem include essential hypertension hyperlipidemia peripheral vascular disease smoking.  Past Medical History:  Diagnosis Date   Acute blood loss anemia 09/25/2023   Anemia, unspecified 03/06/2023   Aortic valve replaced 03/14/2023   Arthritis    Back   Atrial fibrillation (HCC) 03/06/2023   Bilateral impacted cerumen 10/10/2023   Cataract    B/L early cataracts   Cerebral infarction [facility]) 03/06/2023   Feb 01, 2023 Entered By: BONNY DANELLE DEL Comment: RUE weakness + facial droop-[facility],see j[facility].     CHF (congestive heart failure) (HCC)    Cholesterol retinal embolus of 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   Chronic diastolic (congestive) heart failure (HCC) 10/05/2023   Cigarette smoker 07/14/2017   Compression fracture of vertebral column (HCC) 05/31/2023   Controlled type 2 diabetes mellitus without complication, without long-term current use of insulin  (HCC)  09/25/2023   Diabetes mellitus without complication (HCC)    Dysrhythmia    A. Fib   Encounter for fitting and adjustment of hearing aid 03/06/2023   Esophageal reflux 03/06/2023   Essential hypertension 03/06/2023   GERD (gastroesophageal reflux disease)    PMH   H/O mitral valve replacement 25 mm changes done at [facility] 2018 03/06/2023   Heart murmur    Mitral and Aortic Valve replaced 2017 at [facility]   Hematoma 09/25/2023   Hematoma of face 09/24/2023   History of CVA (cerebrovascular accident) 09/25/2023   Hypercholesterolemia    Hypertension    Hypokalemia 09/25/2023   Late effect of cerebrovascular accident (CVA) 03/06/2023   Left atrial thrombus 07/14/2023   Long term (current) use of anticoagulants 03/06/2023   Low back pain 08/30/2023   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/08/2014   Mitral valve stenosis 03/06/2023   Jun 24, 2020 Entered By: WOODROW ALEENE ORN Comment: s/p mech mitral valve in 2017 @ [facility]     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 mellitus (HCC) 03/06/2023   Nicotine dependence 03/06/2023   Obesity 03/06/2023   Obstructive sleep apnea (adult) (pediatric) 03/06/2023   Open angle with borderline findings, low risk, bilateral 12/11/2023   Parotid mass 09/22/2023   Primary open-angle glaucoma, bilateral, 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   Retinal artery branch occlusion, 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   Retinal ischemia 03/06/2023   Right sided abdominal pain 08/22/2023   Seasonal allergies    Sleep apnea  Solitary pulmonary nodule 07/13/2017   CT at [facility] ins [place] req 07/13/2017   - Spirometry 07/13/2017  FEV1 1.57 (50%)  Ratio 81      Special screening for malignant neoplasms, colon 03/06/2023   Oct 08, 2013 Entered By: BONNY DANELLE DEL Comment: Normal colonoscopy 12-19-12-see gi consult     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, 2018 and 03/06/2023   Stroke (HCC) 01/06/2023   weakness right arm   Tobacco use disorder 03/06/2023   Tobacco  user 05/10/2024   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 without macular edema, unspecified eye (HCC) 03/06/2023   Wears glasses     Past Surgical History:  Procedure Laterality Date   BACK SURGERY  1984   Lumbar Spine   CARDIAC VALVE REPLACEMENT  2018   Aortic and Mitral Valve Replacement   COLONOSCOPY  2013   HEMATOMA EVACUATION Right 09/24/2023   Procedure: EVACUATION RIGHT NECK HEMATOMA;  Surgeon: Jesus Oliphant, MD;  Location: [facility] OR;  Service: ENT;  Laterality: Right;   INGUINAL HERNIA REPAIR Left    x2   PAROTIDECTOMY Left 08/08/2014   Procedure: PAROTIDECTOMY;  Surgeon: Norleen Notice, MD;  Location: [facility] OR;  Service: ENT;  Laterality: Left;   PAROTIDECTOMY Right 09/22/2023   Procedure: RIGHT PAROTIDECTOMY;  Surgeon: Carlie Clark, MD;  Location: [facility] OR;  Service: ENT;  Laterality: Right;   TRANSESOPHAGEAL ECHOCARDIOGRAM (CATH LAB) N/A 07/14/2023   Procedure: TRANSESOPHAGEAL ECHOCARDIOGRAM;  Surgeon: Barbaraann Darryle Ned, MD;  Location: [facility] INVASIVE CV LAB;  Service: Cardiovascular;  Laterality: N/A;    Current Medications: Current Meds  Medication Sig   acetaminophen  (TYLENOL ) 500 MG tablet Take 1,500 mg by mouth every 8 (eight) hours as needed for moderate pain (pain score 4-6).   atorvastatin  (LIPITOR) 40 MG tablet Take 1 tablet (40 mg total) by mouth daily.   Cyanocobalamin  (B-12) 2500 MCG TABS Take 2,500 mcg by mouth daily.   latanoprost  (XALATAN ) 0.005 % ophthalmic solution Place 1 drop into both eyes at bedtime.   potassium chloride  (KLOR-CON ) 10 MEQ tablet Take 1 tablet (10 mEq total) by mouth daily.   sacubitril -valsartan  (ENTRESTO ) 49-51 MG Take 1 tablet by mouth 2 (two) times daily.   sitaGLIPtin (JANUVIA) 25 MG tablet Take 25 mg by mouth daily.   traMADol (ULTRAM) 50 MG tablet Take 50 mg by mouth every 8 (eight) hours as needed for moderate pain (pain score 4-6).   warfarin (COUMADIN ) 5 MG tablet TAKE 1 TABLET BY MOUTH DAILY OR AS  DIRECTED BY COUMADIN  CLINIC   [DISCONTINUED] metoprolol  tartrate (LOPRESSOR ) 50 MG tablet Take 50 mg by mouth 2 (two) times daily.     Allergies:   Patient has no known allergies.   Social History   Socioeconomic History   Marital status: Married    Spouse name: Not on file   Number of children: 1   Years of education: Not on file   Highest education level: Not on file  Occupational History   Not on file  Tobacco Use   Smoking status: Former    Average packs/day: 0.5 packs/day for 56.2 years (28.1 ttl pk-yrs)    Types: Cigarettes    Start date: 1968   Smokeless tobacco: Never  Vaping Use   Vaping status: Never Used  Substance and Sexual Activity   Alcohol use: Not Currently    Comment: Rare beer   Drug use: No   Sexual activity: Yes  Other Topics Concern  Not on file  Social History Narrative   Not on file   Social Drivers of Health   Financial Resource Strain: Not on file  Food Insecurity: No Food Insecurity (09/25/2023)   Hunger Vital Sign    Worried About Running Out of Food in the Last Year: Never true    Ran Out of Food in the Last Year: Never true  Transportation Needs: No Transportation Needs (09/25/2023)   PRAPARE - Administrator, Civil Service (Medical): No    Lack of Transportation (Non-Medical): No  Physical Activity: Not on file  Stress: Not on file  Social Connections: Moderately Integrated (09/25/2023)   Social Connection and Isolation Panel    Frequency of Communication with Friends and Family: Three times a week    Frequency of Social Gatherings with Friends and Family: Once a week    Attends Religious Services: 1 to 4 times per year    Active Member of [employer] or Organizations: No    Attends Engineer, structural: Never    Marital Status: Married     Family History: The patient's family history includes Aneurysm in his father; Congestive Heart Failure in his mother and sister. ROS:   Please see the history of present  illness.    All 14 point review of systems negative except as described per history of present illness  EKGs/Labs/Other Studies Reviewed:         Recent Labs: 09/27/2023: BUN 8; Creatinine, Ser 0.86; Magnesium  1.7; Potassium 4.0; Sodium 137 09/28/2023: Hemoglobin 9.4; Platelets 124  Recent Lipid Panel No results found for: CHOL, TRIG, HDL, CHOLHDL, VLDL, LDLCALC, LDLDIRECT  Physical Exam:    VS:  BP 110/64   Pulse 68   Ht 5' 9 (1.753 m)   Wt 225 lb 12.8 oz (102.4 kg)   SpO2 95%   BMI 33.34 kg/m     Wt Readings from Last 3 Encounters:  05/14/24 225 lb 12.8 oz (102.4 kg)  12/13/23 217 lb 6.4 oz (98.6 kg)  09/22/23 210 lb (95.3 kg)     GEN:  Well nourished, well developed in no acute distress HEENT: Normal NECK: No JVD; No carotid bruits LYMPHATICS: No lymphadenopathy CARDIAC: Irregular irregular, crisp mechanical valve sounds no murmurs, no rubs, no gallops RESPIRATORY:  Clear to auscultation without rales, wheezing or rhonchi  ABDOMEN: Soft, non-tender, non-distended MUSCULOSKELETAL:  No edema; No deformity  SKIN: Warm and dry LOWER EXTREMITIES: no swelling NEUROLOGIC:  Alert and oriented x 3 PSYCHIATRIC:  Normal affect   ASSESSMENT:    1. Persistent atrial fibrillation (HCC)   2. 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 History of CVA (cerebrovascular accident)   4. Hypercholesterolemia    PLAN:    In order of problems listed above:  Atrial fibrillation persistent had a long discussion again with him.  We did attempt cardioversion in 2024 however TEE has been done and TEE showed the presence of LAA thrombus.  He insists something need to be done about it and eventually offer him visit with our EP colleagues for conversation about potentially doing another TEE and see if LAA thrombus is still there if not then we may attempt cardioversion however honestly I think success on keeping him in sinus rhythm  was rather low, he may require 2 antiarrhythmic I think there is some value for him to see EP team for evaluation I will schedule him to have it done. Status post  aortic and mitral valve replacement.  Will schedule him to have another echocardiogram and I do this because he is complaining of being weak tired exhausted. Dyslipidemia I did review his K PN which does not have any recent fasting lipid profile, we will schedule him to have them done. Essential hypertension: Well-controlled   Medication Adjustments/Labs and Tests Ordered: Current medicines are reviewed at length with the patient today.  Concerns regarding medicines are outlined above.  Orders Placed This Encounter  Procedures   Ambulatory referral to Cardiac Electrophysiology   ECHOCARDIOGRAM COMPLETE   Medication changes:  Meds ordered this encounter  Medications   metoprolol  tartrate (LOPRESSOR ) 50 MG tablet    Sig: Take 1 tablet (50 mg total) by mouth 2 (two) times daily.    Dispense:  180 tablet    Refill:  3    Signed, Lamar DOROTHA Fitch, MD, [facility] 05/14/2024 10:32 AM    [facility]

## 2024-05-14 NOTE — Patient Instructions (Signed)
 Description   Continue taking 1 tablet daily except 1/2 tablet on Sundays.   Stay consistent with greens each week (1 servings per week)  Recheck INR in 6 weeks.  Coumadin  Clinic [phone] Cardiac Clearance Fax #[phone]

## 2024-05-14 NOTE — Patient Instructions (Signed)
 Medication Instructions:  Your physician recommends that you continue on your current medications as directed. Please refer to the Current Medication list given to you today.  *If you need a refill on your cardiac medications before your next appointment, please call your pharmacy*   Lab Work: None Ordered If you have labs (blood work) drawn today and your tests are completely normal, you will receive your results only by: MyChart Message (if you have MyChart) OR A paper copy in the mail If you have any lab test that is abnormal or we need to change your treatment, we will call you to review the results.   Testing/Procedures: Your physician has requested that you have an echocardiogram. Echocardiography is a painless test that uses sound waves to create images of your heart. It provides your doctor with information about the size and shape of your heart and how well your heart's chambers and valves are working. This procedure takes approximately one hour. There are no restrictions for this procedure. Please do NOT wear cologne, perfume, aftershave, or lotions (deodorant is allowed). Please arrive 15 minutes prior to your appointment time.  Please note: We ask at that you not bring children with you during ultrasound (echo/ vascular) testing. Due to room size and safety concerns, children are not allowed in the ultrasound rooms during exams. Our front office staff cannot provide observation of children in our lobby area while testing is being conducted. An adult accompanying a patient to their appointment will only be allowed in the ultrasound room at the discretion of the ultrasound technician under special circumstances. We apologize for any inconvenience.    Follow-Up: At [facility], you and your health needs are our priority.  As part of our continuing mission to provide you with exceptional heart care, we have created designated Provider Care Teams.  These Care Teams include your  primary Cardiologist (physician) and Advanced Practice Providers (APPs -  Physician Assistants and Nurse Practitioners) who all work together to provide you with the care you need, when you need it.  We recommend signing up for the patient portal called MyChart.  Sign up information is provided on this After Visit Summary.  MyChart is used to connect with patients for Virtual Visits (Telemedicine).  Patients are able to view lab/test results, encounter notes, upcoming appointments, etc.  Non-urgent messages can be sent to your provider as well.   To learn more about what you can do with MyChart, go to [email or web address].    Your next appointment:   6 month(s)  The format for your next appointment:   In Person  Provider:   Lamar Fitch, MD    Other Instructions Appt with Dr. Inocencio

## 2024-06-11 ENCOUNTER — Ambulatory Visit: Attending: Cardiology

## 2024-06-11 DIAGNOSIS — I4819 Other persistent atrial fibrillation: Secondary | ICD-10-CM

## 2024-06-11 LAB — ECHOCARDIOGRAM COMPLETE
AV Mean grad: 7.2 mmHg
AV Peak grad: 15.1 mmHg
Ao pk vel: 1.94 m/s
Area-P 1/2: 3.08 cm2
S' Lateral: 3.7 cm

## 2024-06-13 ENCOUNTER — Telehealth: Payer: Self-pay

## 2024-06-13 ENCOUNTER — Ambulatory Visit: Payer: Self-pay | Admitting: Cardiology

## 2024-06-13 NOTE — Telephone Encounter (Signed)
 Pt viewed Echo results on My Chart per Dr. Vanetta Shawl note. Routed to PCP.

## 2024-06-25 ENCOUNTER — Ambulatory Visit: Attending: Cardiology

## 2024-06-25 DIAGNOSIS — Z952 Presence of prosthetic heart valve: Secondary | ICD-10-CM | POA: Diagnosis not present

## 2024-06-25 DIAGNOSIS — Z7901 Long term (current) use of anticoagulants: Secondary | ICD-10-CM

## 2024-06-25 LAB — POCT INR: INR: 3 (ref 2.0–3.0)

## 2024-06-25 NOTE — Patient Instructions (Signed)
 Continue taking 1 tablet daily except 1/2 tablet on Sundays.   Stay consistent with greens each week (1 servings per week)  Recheck INR in 6 weeks.  Coumadin  Clinic [phone] Cardiac Clearance Fax #[phone]

## 2024-06-26 NOTE — Progress Notes (Signed)
 Electrophysiology Office Note:   Date:  07/01/2024  ID:  James Paul, DOB 03-Aug-1946, MRN [id number]  Primary Cardiologist: James Fitch, MD Primary Heart Failure: None Electrophysiologist: None      History of Present Illness:   James Paul is a 78 y.o. male with h/o AVR/MVR 2019, CVA, atrial fibrillation, hypertension, hyperlipidemia, PAD, tobacco abuse seen today for  for Electrophysiology evaluation of atrial fibrillation at the request of James Paul.    His atrial fibrillation makes him feel significantly fatigued, short of breath.  He had an attempted TEE approximately a year ago, but was found to have left atrial appendage thrombus.  Cardioversion was not performed.  He does not have much energy to exert himself.  He does not have chest tightness or chest pressure.  Discussed the use of AI scribe software for clinical note transcription with the patient, who gave verbal consent to proceed.  History of Present Illness   James Paul is a 78 year old male with atrial fibrillation who presents for evaluation of rhythm management options.  He has experienced atrial fibrillation since a stroke a year and a half ago and has not returned to normal sinus rhythm. A previous cardioversion was not performed due to a blood clot. He is on warfarin with well-controlled INR for the past four months. He experiences fatigue but no shortness of breath. Prior to the stroke, he underwent successful cardioversions. He had both heart valves replaced in 2018, followed by successful cardioversions post-surgery. The stroke affected his right side, with ongoing recovery.      Review of systems complete and found to be negative unless listed in HPI.   EP Information / Studies Reviewed:    EKG is ordered today. Personal review as below.   Atrial fibrillation    Risk Assessment/Calculations:    CHA2DS2-VASc Score = 4   This indicates a 4.8% annual risk of stroke. The patient's score is  based upon: CHF History: 0 HTN History: 1 Diabetes History: 0 Stroke History: 0 Vascular Disease History: 1 Age Score: 2 Gender Score: 0           Physical Exam:   VS:  There were no vitals taken for this visit.   Wt Readings from Last 3 Encounters:  05/14/24 225 lb 12.8 oz (102.4 kg)  12/13/23 217 lb 6.4 oz (98.6 kg)  09/22/23 210 lb (95.3 kg)     GEN: Well nourished, well developed in no acute distress NECK: No JVD; No carotid bruits CARDIAC: Irregularly irregular rate and rhythm, no murmurs, rubs, gallops RESPIRATORY:  Clear to auscultation without rales, wheezing or rhonchi  ABDOMEN: Soft, non-tender, non-distended EXTREMITIES:  No edema; No deformity   ASSESSMENT AND PLAN:    1.  Persistent atrial fibrillation: Cardioversion attempted in 2024 but had left atrial appendage thrombus.  He would prefer rhythm control as he feels fatigued and short of breath.  He would like to avoid long-term antiarrhythmics due to the reduced efficacy and long-term standing persistent atrial fibrillation and the increased efficacy of ablation.  James Paul plan for ablation.  Risks and benefits have been discussed.  He understands the risks and has agreed to the procedure.  Risk, benefits, and alternatives to EP study and radiofrequency/pulse field ablation for afib were also discussed in detail today. These risks include but are not limited to stroke, bleeding, vascular damage, tamponade, perforation, damage to the esophagus, lungs, and other structures, pulmonary vein stenosis, worsening renal function, and death. The patient understands these  risk and wishes to proceed.  We James Paul therefore proceed with catheter ablation at the next available time.  Carto, ICE, anesthesia are requested for the procedure.  This patient James Paul require CT prior to ablation. To be scheduled.   2.  Secondary hypercoagulable state: On warfarin for MVR and atrial fibrillation  3.  Hyperlipidemia: Continue statin per primary  cardiology  4.  Hypertension: Well-controlled  5.  AVR/MVR: Plan per primary cardiology   Follow up with Afib Clinic as usual post procedure  Signed, James Dolinski Gladis Norton, MD

## 2024-07-01 ENCOUNTER — Encounter: Payer: Self-pay | Admitting: Cardiology

## 2024-07-01 ENCOUNTER — Ambulatory Visit: Attending: Cardiology | Admitting: Cardiology

## 2024-07-01 VITALS — BP 126/78 | HR 76 | Wt 224.2 lb

## 2024-07-01 DIAGNOSIS — Z952 Presence of prosthetic heart valve: Secondary | ICD-10-CM | POA: Diagnosis not present

## 2024-07-01 DIAGNOSIS — D6869 Other thrombophilia: Secondary | ICD-10-CM | POA: Diagnosis not present

## 2024-07-01 DIAGNOSIS — I4811 Longstanding persistent atrial fibrillation: Secondary | ICD-10-CM

## 2024-07-01 DIAGNOSIS — Z9889 Other specified postprocedural states: Secondary | ICD-10-CM | POA: Diagnosis not present

## 2024-07-01 NOTE — Patient Instructions (Signed)
 Medication Instructions:  Your physician recommends that you continue on your current medications as directed. Please refer to the Current Medication list given to you today.  *If you need a refill on your cardiac medications before your next appointment, please call your pharmacy*   Lab Work: Pre procedure labs -- we will call you to schedule:  BMP & CBC  If you have a lab test that is abnormal and we need to change your treatment, we will call you to review the results -- otherwise no news is good news.    Testing/Procedures: Your physician has requested that you have cardiac CT 3 weeks PRIOR to your ablation. Cardiac computed tomography (CT) is a painless test that uses an x-ray machine to take clear, detailed pictures of your heart. We will contact you if the result is abnormal. We will call you to schedule.  Your physician has recommended that you have an ablation. Catheter ablation is a medical procedure used to treat some cardiac arrhythmias (irregular heartbeats). During catheter ablation, a long, thin, flexible tube is put into a blood vessel in your groin (upper thigh), or neck. This tube is called an ablation catheter. It is then guided to your heart through the blood vessel. Radio frequency waves destroy small areas of heart tissue where abnormal heartbeats may cause an arrhythmia to start. Please review the information below on ablation and after care.  Your ablation will be scheduled for next year.  The office will contact you once January/February dates are available   Follow-Up: At [facility], you and your health needs are our priority.  As part of our continuing mission to provide you with exceptional heart care, we have created designated Provider Care Teams.  These Care Teams include your primary Cardiologist (physician) and Advanced Practice Providers (APPs -  Physician Assistants and Nurse Practitioners) who all work together to provide you with the care you need, when  you need it.  Your next appointment:   1 month(s) after your ablation  The format for your next appointment:   In Person  Provider:   AFib clinic   Thank you for choosing [facility]!!   Maeola Domino, RN [phone]    Other Instructions   Cardiac Ablation Cardiac ablation is a procedure to destroy (ablate) some heart tissue that is sending bad signals. These bad signals cause problems in heart rhythm. The heart has many areas that make these signals. If there are problems in these areas, they can make the heart beat in a way that is not normal. Destroying some tissues can help make the heart rhythm normal. Tell your doctor about: Any allergies you have. All medicines you are taking. These include vitamins, herbs, eye drops, creams, and over-the-counter medicines. Any problems you or family members have had with medicines that make you fall asleep (anesthetics). Any blood disorders you have. Any surgeries you have had. Any medical conditions you have, such as kidney failure. Whether you are pregnant or may be pregnant. What are the risks? This is a safe procedure. But problems may occur, including: Infection. Bruising and bleeding. Bleeding into the chest. Stroke or blood clots. Damage to nearby areas of your body. Allergies to medicines or dyes. The need for a pacemaker if the normal system is damaged. Failure of the procedure to treat the problem. What happens before the procedure? Medicines Ask your doctor about: Changing or stopping your normal medicines. This is important. Taking aspirin and ibuprofen. Do not take these medicines unless  your doctor tells you to take them. Taking other medicines, vitamins, herbs, and supplements. General instructions Follow instructions from your doctor about what you cannot eat or drink. Plan to have someone take you home from the hospital or clinic. If you will be going home right after the procedure, plan to have  someone with you for 24 hours. Ask your doctor what steps will be taken to prevent infection. What happens during the procedure?  An IV tube will be put into one of your veins. You will be given a medicine to help you relax. The skin on your neck or groin will be numbed. A cut (incision) will be made in your neck or groin. A needle will be put through your cut and into a large vein. A tube (catheter) will be put into the needle. The tube will be moved to your heart. Dye may be put through the tube. This helps your doctor see your heart. Small devices (electrodes) on the tube will send out signals. A type of energy will be used to destroy some heart tissue. The tube will be taken out. Pressure will be held on your cut. This helps stop bleeding. A bandage will be put over your cut. The exact procedure may vary among doctors and hospitals. What happens after the procedure? You will be watched until you leave the hospital or clinic. This includes checking your heart rate, breathing rate, oxygen, and blood pressure. Your cut will be watched for bleeding. You will need to lie still for a few hours. Do not drive for 24 hours or as long as your doctor tells you. Summary Cardiac ablation is a procedure to destroy some heart tissue. This is done to treat heart rhythm problems. Tell your doctor about any medical conditions you may have. Tell him or her about all medicines you are taking to treat them. This is a safe procedure. But problems may occur. These include infection, bruising, bleeding, and damage to nearby areas of your body. Follow what your doctor tells you about food and drink. You may also be told to change or stop some of your medicines. After the procedure, do not drive for 24 hours or as long as your doctor tells you. This information is not intended to replace advice given to you by your health care provider. Make sure you discuss any questions you have with your health care  provider. Document Revised: 12/03/2021 Document Reviewed: 08/15/2019 Elsevier Patient Education  2023 Elsevier Inc.   Cardiac Ablation, Care After  This sheet gives you information about how to care for yourself after your procedure. Your health care provider may also give you more specific instructions. If you have problems or questions, contact your health care provider. What can I expect after the procedure? After the procedure, it is common to have: Bruising around your puncture site. Tenderness around your puncture site. Skipped heartbeats. If you had an atrial fibrillation ablation, you may have atrial fibrillation during the first several months after your procedure.  Tiredness (fatigue).  Follow these instructions at home: Puncture site care  Follow instructions from your health care provider about how to take care of your puncture site. Make sure you: If present, leave stitches (sutures), skin glue, or adhesive strips in place. These skin closures may need to stay in place for up to 2 weeks. If adhesive strip edges start to loosen and curl up, you may trim the loose edges. Do not remove adhesive strips completely unless your health care provider  tells you to do that. If a large square bandage is present, this may be removed 24 hours after surgery.  Check your puncture site every day for signs of infection. Check for: Redness, swelling, or pain. Fluid or blood. If your puncture site starts to bleed, lie down on your back, apply firm pressure to the area, and contact your health care provider. Warmth. Pus or a bad smell. A pea or marble sized lump/knot at the site is normal and can take up to three months to resolve.  Driving Do not drive for at least 4 days after your procedure or however long your health care provider recommends. (Do not resume driving if you have previously been instructed not to drive for other health reasons.) Do not drive or use heavy machinery while taking  prescription pain medicine. Activity Avoid activities that take a lot of effort for at least 7 days after your procedure. Do not lift anything that is heavier than 5 lb (4.5 kg) for one week.  No sexual activity for 1 week.  Return to your normal activities as told by your health care provider. Ask your health care provider what activities are safe for you. General instructions Take over-the-counter and prescription medicines only as told by your health care provider. Do not use any products that contain nicotine or tobacco, such as cigarettes and e-cigarettes. If you need help quitting, ask your health care provider. You may shower after 24 hours, but Do not take baths, swim, or use a hot tub for 1 week.  Do not drink alcohol for 24 hours after your procedure. Keep all follow-up visits as told by your health care provider. This is important. Contact a health care provider if: You have redness, mild swelling, or pain around your puncture site. You have fluid or blood coming from your puncture site that stops after applying firm pressure to the area. Your puncture site feels warm to the touch. You have pus or a bad smell coming from your puncture site. You have a fever. You have chest pain or discomfort that spreads to your neck, jaw, or arm. You have chest pain that is worse with lying on your back or taking a deep breath. You are sweating a lot. You feel nauseous. You have a fast or irregular heartbeat. You have shortness of breath. You are dizzy or light-headed and feel the need to lie down. You have pain or numbness in the arm or leg closest to your puncture site. Get help right away if: Your puncture site suddenly swells. Your puncture site is bleeding and the bleeding does not stop after applying firm pressure to the area. These symptoms may represent a serious problem that is an emergency. Do not wait to see if the symptoms will go away. Get medical help right away. Call your local  emergency services (911 in the [place] Do not drive yourself to the hospital. Summary After the procedure, it is normal to have bruising and tenderness at the puncture site in your groin, neck, or forearm. Check your puncture site every day for signs of infection. Get help right away if your puncture site is bleeding and the bleeding does not stop after applying firm pressure to the area. This is a medical emergency. This information is not intended to replace advice given to you by your health care provider. Make sure you discuss any questions you have with your health care provider.

## 2024-08-06 ENCOUNTER — Ambulatory Visit: Attending: Cardiology

## 2024-08-06 DIAGNOSIS — Z7901 Long term (current) use of anticoagulants: Secondary | ICD-10-CM

## 2024-08-06 DIAGNOSIS — Z952 Presence of prosthetic heart valve: Secondary | ICD-10-CM | POA: Diagnosis not present

## 2024-08-06 LAB — POCT INR: INR: 3.9 — AB (ref 2.0–3.0)

## 2024-08-06 NOTE — Patient Instructions (Signed)
 Continue taking 1 tablet daily except 1/2 tablet on Sundays.  Eat greens tonight or tomorrow. Stay consistent with greens each week (1 servings per week)  Recheck INR in 6 weeks.  Coumadin  Clinic [phone] Cardiac Clearance Fax #[phone]

## 2024-08-20 ENCOUNTER — Telehealth: Payer: Self-pay | Admitting: *Deleted

## 2024-08-20 DIAGNOSIS — Z01812 Encounter for preprocedural laboratory examination: Secondary | ICD-10-CM

## 2024-08-20 DIAGNOSIS — I4811 Longstanding persistent atrial fibrillation: Secondary | ICD-10-CM

## 2024-08-20 NOTE — Telephone Encounter (Signed)
 Pt agreeable to 10/25/2024 afib ablation.   Aware CT is needed prior to and hospital will schedule this testing. Aware will be sending CT instructions today, ablation instructions at a later date (to be sent via mychart) Patient & wife verbalized understanding and agreeable to plan.

## 2024-08-20 NOTE — Telephone Encounter (Signed)
 Left message to call back to schedule afib ablation  (Needs CT, using Carto, Coumadin  pt, [facility] pt)

## 2024-08-20 NOTE — Telephone Encounter (Signed)
 Pt returning call to schedule. Please advise.

## 2024-09-04 NOTE — Telephone Encounter (Signed)
 James Paul

## 2024-09-17 ENCOUNTER — Ambulatory Visit: Attending: Cardiology

## 2024-09-17 DIAGNOSIS — Z7901 Long term (current) use of anticoagulants: Secondary | ICD-10-CM | POA: Diagnosis not present

## 2024-09-17 DIAGNOSIS — Z952 Presence of prosthetic heart valve: Secondary | ICD-10-CM

## 2024-09-17 LAB — POCT INR: INR: 4.1 — AB (ref 2.0–3.0)

## 2024-09-17 NOTE — Patient Instructions (Signed)
 Hold tonight only then Continue taking 1 tablet daily except 1/2 tablet on Sundays.   Stay consistent with greens each week (1 servings per week)  Recheck INR in 2 weeks.  Coumadin  Clinic [phone] Cardiac Clearance Fax #[phone]

## 2024-09-24 ENCOUNTER — Telehealth: Payer: Self-pay

## 2024-09-24 NOTE — Telephone Encounter (Signed)
-----   Message from Nurse Maeola SQUIBB, RN sent at 08/26/2024  9:47 AM EST ----- Regarding: 10/25/2024  AFib ablation Precert:  MD: Camnitz Type of ablation: A-fib Diagnosis: tachycardia CPT code: A-fib (06343) Ablation scheduled (date/time): 10/25/24  9:30 am  Procedure:  Added to calendar? Yes Orders entered? Yes Letter complete? No, >30 days before procedure Scheduled with cath lab? Yes Any medications to hold? Routine, Jardiance, Januvia Labs ordered (CBC, BMET, PT/INR if on warfarin): Yes Mapping system: CARTO (lab 4 or 6) CARTO/OPAL rep notified? Yes Cardiac CT needed? Yes, ordered Dye allergy? No Pre-meds ordered and instructions given? N/a Letter method: MyChart H&P: 07/01/24 Device: No  Follow-up:  Cassie/Angel, please schedule Routine.

## 2024-09-27 ENCOUNTER — Telehealth: Payer: Self-pay | Admitting: Cardiology

## 2024-09-27 NOTE — Telephone Encounter (Signed)
 Pt c/o medication issue:  1. Name of Medication: sacubitril -valsartan  (ENTRESTO ) 49-51 MG   2. How are you currently taking this medication (dosage and times per day)? As written  3. Are you having a reaction (difficulty breathing--STAT)?   4. What is your medication issue?   Medication is now not covered by insurance. Pt looking for cheaper option.

## 2024-09-30 ENCOUNTER — Other Ambulatory Visit (HOSPITAL_COMMUNITY): Payer: Self-pay

## 2024-09-30 ENCOUNTER — Telehealth: Payer: Self-pay | Admitting: Pharmacy Technician

## 2024-09-30 NOTE — Telephone Encounter (Signed)
 Pharmacy Patient Advocate Encounter   Received notification from Physician's Office that prior authorization for Entresto   is required/requested.   Insurance verification completed.   The patient is insured through [facility] ADVANTAGE/RX ADVANCE.   Per test claim: Refill too soon. PA is not needed at this time. Medication was filled 09/26/24. Next eligible fill date is 10/19/24.

## 2024-09-30 NOTE — Telephone Encounter (Signed)
 Spoke with pharmacist at [facility]- He ran the generic and it was a no copay for the patient.

## 2024-09-30 NOTE — Telephone Encounter (Signed)
 Enresto 49-51 #180 ref x 3 sent to [facility]

## 2024-10-01 ENCOUNTER — Ambulatory Visit: Attending: Cardiology

## 2024-10-01 DIAGNOSIS — Z7901 Long term (current) use of anticoagulants: Secondary | ICD-10-CM

## 2024-10-01 DIAGNOSIS — Z952 Presence of prosthetic heart valve: Secondary | ICD-10-CM | POA: Diagnosis not present

## 2024-10-01 LAB — POCT INR: INR: 3.3 — AB (ref 2.0–3.0)

## 2024-10-01 NOTE — Patient Instructions (Signed)
 Continue taking 1 tablet daily except 1/2 tablet on Sundays.   Stay consistent with greens each week (1 servings per week)  Recheck INR in 1 week.  Coumadin  Clinic [phone] Cardiac Clearance Fax #[phone]

## 2024-10-02 LAB — BASIC METABOLIC PANEL WITH GFR
BUN/Creatinine Ratio: 10 (ref 10–24)
BUN: 13 mg/dL (ref 8–27)
CO2: 21 mmol/L (ref 20–29)
Calcium: 9.2 mg/dL (ref 8.6–10.2)
Chloride: 101 mmol/L (ref 96–106)
Creatinine, Ser: 1.29 mg/dL — ABNORMAL HIGH (ref 0.76–1.27)
Glucose: 270 mg/dL — ABNORMAL HIGH (ref 70–99)
Potassium: 4.1 mmol/L (ref 3.5–5.2)
Sodium: 138 mmol/L (ref 134–144)
eGFR: 57 mL/min/1.73 — ABNORMAL LOW

## 2024-10-02 LAB — CBC
Hematocrit: 43 % (ref 37.5–51.0)
Hemoglobin: 14.5 g/dL (ref 13.0–17.7)
MCH: 32.3 pg (ref 26.6–33.0)
MCHC: 33.7 g/dL (ref 31.5–35.7)
MCV: 96 fL (ref 79–97)
Platelets: 104 x10E3/uL — ABNORMAL LOW (ref 150–450)
RBC: 4.49 x10E6/uL (ref 4.14–5.80)
RDW: 14.7 % (ref 11.6–15.4)
WBC: 7.8 x10E3/uL (ref 3.4–10.8)

## 2024-10-04 ENCOUNTER — Ambulatory Visit (HOSPITAL_COMMUNITY)
Admission: RE | Admit: 2024-10-04 | Discharge: 2024-10-04 | Disposition: A | Source: Ambulatory Visit | Attending: Cardiology | Admitting: Cardiology

## 2024-10-04 DIAGNOSIS — I4811 Longstanding persistent atrial fibrillation: Secondary | ICD-10-CM | POA: Insufficient documentation

## 2024-10-04 MED ORDER — IOHEXOL 350 MG/ML SOLN
80.0000 mL | Freq: Once | INTRAVENOUS | Status: AC | PRN
  Administered 2024-10-04: 80 mL via INTRAVENOUS

## 2024-10-07 ENCOUNTER — Encounter (HOSPITAL_COMMUNITY): Payer: Self-pay

## 2024-10-08 ENCOUNTER — Telehealth (HOSPITAL_COMMUNITY): Payer: Self-pay

## 2024-10-08 ENCOUNTER — Ambulatory Visit: Attending: Cardiology

## 2024-10-08 DIAGNOSIS — Z952 Presence of prosthetic heart valve: Secondary | ICD-10-CM | POA: Diagnosis not present

## 2024-10-08 DIAGNOSIS — Z7901 Long term (current) use of anticoagulants: Secondary | ICD-10-CM

## 2024-10-08 LAB — POCT INR: INR: 2.1 (ref 2.0–3.0)

## 2024-10-08 NOTE — Patient Instructions (Signed)
 Take 2 tablets today only then Continue taking 1 tablet daily except 1/2 tablet on Sundays.  Ablation 1/30 weekly INRs Stay consistent with greens each week (1 servings per week)  Recheck INR in 1 week.  Coumadin  Clinic [phone] Cardiac Clearance Fax #[phone]

## 2024-10-08 NOTE — Telephone Encounter (Signed)
 Spoke with patient to discuss upcoming procedure.   CT: completed.  Labs: completed.   Any recent signs of acute illness or been started on antibiotics? No Any new medications started?No  Any missed doses of blood thinner?  No  Advised patient to continue taking Coumadin  (Warfarin) once daily without missing any doses. Any medications to hold?  HOLD: Empagliflozin (Jardiance) for 3 days prior to the procedure. Last dose on Monday, January 26.  Medication instructions:  On the morning of your procedure DO NOT take any medication., including Coumadin  (Warfarin) or the procedure may be rescheduled. Nothing to eat or drink after midnight prior to your procedure.  Confirmed patient is scheduled for Atrial Fibrillation Ablation on Friday, January 30 with Dr. Inocencio. Instructed patient to arrive at the Main Entrance A at [facility]: [address] [place], [place] [address] and check in at Admitting at 7:30 AM.   Plan to go home the same day, you will only stay overnight if medically necessary. You MUST have a responsible adult to drive you home and MUST be with you the first 24 hours after you arrive home or your procedure could be cancelled.  Informed patient a nurse will call a day before the procedure to confirm arrival time and ensure instructions are followed.  Patient verbalized understanding to all instructions provided and agreed to proceed with procedure.   Advised patient to contact RN Navigator at [phone], to inform of any new medications started after call or concerns prior to procedure.

## 2024-10-08 NOTE — Telephone Encounter (Signed)
 Attempted to reach patient to discuss upcoming procedure, no answer. Left VM for patient to return call.

## 2024-10-09 ENCOUNTER — Ambulatory Visit: Payer: Self-pay | Admitting: Cardiology

## 2024-10-15 ENCOUNTER — Ambulatory Visit: Attending: Cardiology

## 2024-10-15 DIAGNOSIS — Z952 Presence of prosthetic heart valve: Secondary | ICD-10-CM | POA: Diagnosis not present

## 2024-10-15 DIAGNOSIS — Z7901 Long term (current) use of anticoagulants: Secondary | ICD-10-CM

## 2024-10-15 LAB — POCT INR: INR: 3.1 — AB (ref 2.0–3.0)

## 2024-10-15 NOTE — Patient Instructions (Signed)
 Continue taking 1 tablet daily except 1/2 tablet on Sundays.  Ablation 1/30 weekly INRs Stay consistent with greens each week (1 servings per week)  Recheck INR in 1 week.  Coumadin  Clinic [phone] Cardiac Clearance Fax #[phone]

## 2024-10-22 ENCOUNTER — Ambulatory Visit: Attending: Cardiology

## 2024-10-22 DIAGNOSIS — Z952 Presence of prosthetic heart valve: Secondary | ICD-10-CM

## 2024-10-22 DIAGNOSIS — Z7901 Long term (current) use of anticoagulants: Secondary | ICD-10-CM | POA: Diagnosis not present

## 2024-10-22 LAB — POCT INR: INR: 2.8 (ref 2.0–3.0)

## 2024-10-22 NOTE — Patient Instructions (Signed)
 Take 1.5 tablets today only then Continue taking 1 tablet daily except 1/2 tablet on Sundays.  Ablation 1/30 weekly INRs Stay consistent with greens each week (1 servings per week)  Recheck INR in 3 weeks.  Coumadin  Clinic [phone] Cardiac Clearance Fax #[phone]

## 2024-10-24 ENCOUNTER — Telehealth (HOSPITAL_COMMUNITY): Payer: Self-pay

## 2024-10-24 NOTE — Telephone Encounter (Signed)
 Received a call from patient's wife, Slater. States patient received several MyChart messages recently, but is unable to retrieve them and wanted to make sure it was not regarding his procedure on tomorrow. Advised, they were all messages regarding his appointments (post procedure and Dr. Bernie).  Instructions reviewed: Arrival time- 0730 on Jan 30 to [facility] admitting Do NOT miss any doses of Warfarin as directed  Nothing to eat or drink after midnight No meds AM of procedure, including warfarin  Last dose of Jardiance on Jan 26. Responsible person to drive you home and stay with you for 24 hours Contact for any changes in health status or start of any new medications Contact information for Nurse Navigator provided.  Slater verbalized understanding to all information provided.

## 2024-10-25 ENCOUNTER — Ambulatory Visit (HOSPITAL_COMMUNITY)
Admission: RE | Admit: 2024-10-25 | Discharge: 2024-10-25 | Disposition: A | Discharge status: Home / Self Care | Attending: Cardiology | Admitting: Cardiology

## 2024-10-25 ENCOUNTER — Other Ambulatory Visit: Payer: Self-pay | Admitting: Cardiology

## 2024-10-25 ENCOUNTER — Encounter (HOSPITAL_COMMUNITY)
Admission: RE | Disposition: A | Discharge status: Home / Self Care | Payer: Self-pay | Source: Home / Self Care | Attending: Cardiology

## 2024-10-25 ENCOUNTER — Other Ambulatory Visit: Payer: Self-pay

## 2024-10-25 ENCOUNTER — Ambulatory Visit (HOSPITAL_COMMUNITY): Admitting: Anesthesiology

## 2024-10-25 ENCOUNTER — Encounter (HOSPITAL_COMMUNITY): Payer: Self-pay | Admitting: Cardiology

## 2024-10-25 ENCOUNTER — Encounter: Payer: Self-pay | Admitting: Emergency Medicine

## 2024-10-25 DIAGNOSIS — Z952 Presence of prosthetic heart valve: Secondary | ICD-10-CM

## 2024-10-25 DIAGNOSIS — Z87891 Personal history of nicotine dependence: Secondary | ICD-10-CM | POA: Diagnosis not present

## 2024-10-25 DIAGNOSIS — Z79899 Other long term (current) drug therapy: Secondary | ICD-10-CM | POA: Insufficient documentation

## 2024-10-25 DIAGNOSIS — I4891 Unspecified atrial fibrillation: Secondary | ICD-10-CM | POA: Diagnosis not present

## 2024-10-25 DIAGNOSIS — I483 Typical atrial flutter: Secondary | ICD-10-CM | POA: Diagnosis not present

## 2024-10-25 DIAGNOSIS — I10 Essential (primary) hypertension: Secondary | ICD-10-CM | POA: Diagnosis not present

## 2024-10-25 DIAGNOSIS — E785 Hyperlipidemia, unspecified: Secondary | ICD-10-CM | POA: Diagnosis not present

## 2024-10-25 DIAGNOSIS — Z720 Tobacco use: Secondary | ICD-10-CM | POA: Diagnosis not present

## 2024-10-25 DIAGNOSIS — Z8673 Personal history of transient ischemic attack (TIA), and cerebral infarction without residual deficits: Secondary | ICD-10-CM | POA: Insufficient documentation

## 2024-10-25 DIAGNOSIS — E119 Type 2 diabetes mellitus without complications: Secondary | ICD-10-CM | POA: Diagnosis not present

## 2024-10-25 DIAGNOSIS — I739 Peripheral vascular disease, unspecified: Secondary | ICD-10-CM | POA: Insufficient documentation

## 2024-10-25 DIAGNOSIS — I4819 Other persistent atrial fibrillation: Secondary | ICD-10-CM | POA: Diagnosis present

## 2024-10-25 DIAGNOSIS — Z5181 Encounter for therapeutic drug level monitoring: Secondary | ICD-10-CM

## 2024-10-25 LAB — GLUCOSE, CAPILLARY: Glucose-Capillary: 229 mg/dL — ABNORMAL HIGH (ref 70–99)

## 2024-10-25 LAB — POCT ACTIVATED CLOTTING TIME
Activated Clotting Time: 0 s
Activated Clotting Time: >1000 s
Activated Clotting Time: 399 s

## 2024-10-25 LAB — PROTIME-INR
INR: 3.3 — ABNORMAL HIGH (ref 0.8–1.2)
Prothrombin Time: 35.4 s — ABNORMAL HIGH (ref 11.4–15.2)

## 2024-10-25 MED ORDER — SODIUM CHLORIDE 0.9 % IV SOLN: INTRAVENOUS | Status: DC

## 2024-10-25 MED ORDER — ACETAMINOPHEN 325 MG PO TABS: 650.0000 mg | ORAL_TABLET | ORAL | Status: DC | PRN

## 2024-10-25 MED ORDER — ATROPINE SULFATE 1 MG/10ML IJ SOSY
PREFILLED_SYRINGE | INTRAMUSCULAR | Status: DC | PRN
  Administered 2024-10-25: 1 mg via INTRAVENOUS

## 2024-10-25 MED ORDER — HEPARIN SODIUM (PORCINE) 1000 UNIT/ML IJ SOLN
INTRAMUSCULAR | Status: DC | PRN
  Administered 2024-10-25: 15000 [IU] via INTRAVENOUS

## 2024-10-25 MED ORDER — HEPARIN (PORCINE) IN NACL 1000-0.9 UT/500ML-% IV SOLN
INTRAVENOUS | Status: DC | PRN
  Administered 2024-10-25 (×2): 500 mL

## 2024-10-25 MED ORDER — HEPARIN SODIUM (PORCINE) 1000 UNIT/ML IJ SOLN
INTRAMUSCULAR | Status: DC | PRN
  Administered 2024-10-25: 1000 [IU] via INTRAVENOUS

## 2024-10-25 MED ORDER — ONDANSETRON HCL 4 MG/2ML IJ SOLN
INTRAMUSCULAR | Status: DC | PRN
  Administered 2024-10-25: 4 mg via INTRAVENOUS

## 2024-10-25 MED ORDER — FENTANYL CITRATE (PF) 100 MCG/2ML IJ SOLN
INTRAMUSCULAR | Status: AC
  Filled 2024-10-25: qty 2

## 2024-10-25 MED ORDER — HEPARIN SODIUM (PORCINE) 1000 UNIT/ML IJ SOLN
INTRAMUSCULAR | Status: AC
  Filled 2024-10-25: qty 10

## 2024-10-25 MED ORDER — DEXAMETHASONE SOD PHOSPHATE PF 10 MG/ML IJ SOLN
INTRAMUSCULAR | Status: DC | PRN
  Administered 2024-10-25: 5 mg via INTRAVENOUS

## 2024-10-25 MED ORDER — ONDANSETRON HCL 4 MG/2ML IJ SOLN: 4.0000 mg | Freq: Four times a day (QID) | INTRAMUSCULAR | Status: DC | PRN

## 2024-10-25 MED ORDER — ROCURONIUM BROMIDE 10 MG/ML (PF) SYRINGE
PREFILLED_SYRINGE | INTRAVENOUS | Status: DC | PRN
  Administered 2024-10-25: 1 mg via INTRAVENOUS
  Administered 2024-10-25: 10 mg via INTRAVENOUS
  Administered 2024-10-25: 50 mg via INTRAVENOUS

## 2024-10-25 MED ORDER — PROPOFOL 10 MG/ML IV BOLUS
INTRAVENOUS | Status: DC | PRN
  Administered 2024-10-25: 140 mg via INTRAVENOUS

## 2024-10-25 MED ORDER — ATROPINE SULFATE 1 MG/10ML IJ SOSY
PREFILLED_SYRINGE | INTRAMUSCULAR | Status: AC
  Filled 2024-10-25: qty 10

## 2024-10-25 MED ORDER — LIDOCAINE 2% (20 MG/ML) 5 ML SYRINGE
INTRAMUSCULAR | Status: DC | PRN
  Administered 2024-10-25: 100 mg via INTRAVENOUS

## 2024-10-25 MED ORDER — PROTAMINE SULFATE 10 MG/ML IV SOLN
INTRAVENOUS | Status: DC | PRN
  Administered 2024-10-25: 40 mg via INTRAVENOUS

## 2024-10-25 MED ORDER — PHENYLEPHRINE 80 MCG/ML (10ML) SYRINGE FOR IV PUSH (FOR BLOOD PRESSURE SUPPORT)
PREFILLED_SYRINGE | INTRAVENOUS | Status: DC | PRN
  Administered 2024-10-25 (×2): 160 ug via INTRAVENOUS
  Administered 2024-10-25 (×2): 200 ug via INTRAVENOUS
  Administered 2024-10-25 (×3): 160 ug via INTRAVENOUS
  Administered 2024-10-25: 40 ug via INTRAVENOUS
  Administered 2024-10-25: 160 ug via INTRAVENOUS

## 2024-10-25 MED ORDER — FENTANYL CITRATE (PF) 250 MCG/5ML IJ SOLN
INTRAMUSCULAR | Status: DC | PRN
  Administered 2024-10-25 (×2): 50 ug via INTRAVENOUS

## 2024-10-25 MED ORDER — SUGAMMADEX SODIUM 200 MG/2ML IV SOLN
INTRAVENOUS | Status: DC | PRN
  Administered 2024-10-25: 198.6 mg via INTRAVENOUS

## 2024-10-25 NOTE — Discharge Instructions (Signed)

## 2024-10-25 NOTE — Anesthesia Preprocedure Evaluation (Addendum)
 "                                  Anesthesia Evaluation  Patient identified by MRN, date of birth, ID band Patient awake    Reviewed: Allergy & Precautions, NPO status , Patient's Chart, lab work & pertinent test results  History of Anesthesia Complications Negative for: history of anesthetic complications  Airway Mallampati: II  TM Distance: >3 FB Neck ROM: Full    Dental  (+) Upper Dentures, Lower Dentures   Pulmonary sleep apnea and Continuous Positive Airway Pressure Ventilation , neg COPD, Patient abstained from smoking.Not current smoker, former smoker   Pulmonary exam normal breath sounds clear to auscultation       Cardiovascular Exercise Tolerance: Good METShypertension, (-) CAD and (-) Past MI + dysrhythmias Atrial Fibrillation  Rhythm:Irregular Rate:Normal - Systolic murmurs 1. Left ventricular ejection fraction, by estimation, is 60 to 65%. The  left ventricle has normal function. The left ventricle has no regional  wall motion abnormalities. There is mild left ventricular hypertrophy.  Left ventricular diastolic parameters  are indeterminate.   2. Right ventricular systolic function is normal. The right ventricular  size is normal.   3. The mitral valve has been repaired/replaced. No evidence of mitral  valve regurgitation. No evidence of mitral stenosis. The mean mitral valve  gradient is 4.0 mmHg. There is a 25 mm present in the mitral position.  Procedure Date: 2019.   4. The aortic valve has been repaired/replaced. Aortic valve  regurgitation is not visualized. No aortic stenosis is present. There is a  23 mm St. Jude valve present in the aortic position. Procedure Date: 2018.  Echo findings are consistent with normal  structure and function of the aortic valve prosthesis. Aortic valve mean  gradient measures 7.2 mmHg.   5. The inferior vena cava is normal in size with greater than 50%  respiratory variability, suggesting right atrial pressure  of 3 mmHg.     Neuro/Psych CVA negative neurological ROS  negative psych ROS   GI/Hepatic ,GERD  Controlled,,(+)     (-) substance abuse    Endo/Other  diabetes, Oral Hypoglycemic Agents    Renal/GU negative Renal ROS     Musculoskeletal   Abdominal   Peds  Hematology   Anesthesia Other Findings Past Medical History: 09/25/2023: Acute blood loss anemia 03/06/2023: Anemia, unspecified 03/14/2023: Aortic valve replaced No date: Arthritis     Comment:  Back 03/06/2023: Atrial fibrillation (HCC) 10/10/2023: Bilateral impacted cerumen No date: Cataract     Comment:  B/L early cataracts 03/06/2023: Cerebral infarction [facility])     Comment:  Feb 01, 2023 Entered By: BONNY DANELLE DEL Comment: RUE               weakness + facial droop-[facility],see jlv.   No date: CHF (congestive heart failure) (HCC) 03/06/2023: Cholesterol retinal embolus of left eye 10/05/2023: Chronic diastolic (congestive) heart failure (HCC) 07/14/2017: Cigarette smoker 05/31/2023: Compression fracture of vertebral column (HCC) 09/25/2023: Controlled type 2 diabetes mellitus without complication,  without long-term current use of insulin  (HCC) No date: Diabetes mellitus without complication (HCC) No date: Dysrhythmia     Comment:  A. 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 mm changes done [facility]  2018  No date: Heart murmur     Comment:  Mitral and Aortic Valve replaced 2017 at [facility] 09/25/2023: Hematoma 09/24/2023: Hematoma of face 09/25/2023: History of CVA (cerebrovascular accident) No date: Hypercholesterolemia No date: Hypertension 09/25/2023: Hypokalemia 03/06/2023: Late effect of cerebrovascular accident (CVA) 07/14/2023: Left atrial thrombus 03/06/2023: Long term (current) use  of anticoagulants 08/30/2023: Low back pain 08/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 03/06/2023: Mitral valve stenosis     Comment:  Jun 24, 2020 Entered By: WOODROW ALEENE ORN Comment: s/p               mech mitral valve in 2017 @ [facility]   03/06/2023: 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 mellitus (HCC) 03/06/2023: Nicotine dependence 03/06/2023: Obesity 03/06/2023: Obstructive sleep apnea (adult) (pediatric) 12/11/2023: Open angle with borderline findings, low risk, bilateral 09/22/2023: Parotid mass 03/06/2023: Primary open-angle glaucoma, bilateral, 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: Retinal artery branch occlusion, 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: Retinal ischemia 08/22/2023: Right sided abdominal pain No date: Seasonal allergies No date: Sleep apnea 07/13/2017: Solitary pulmonary nodule     Comment:  CT at [facility] ins [place] req 07/13/2017   - Spirometry               07/13/2017  FEV1 1.57 (50%)  Ratio 81    03/06/2023: Special screening for malignant neoplasms, colon     Comment:  Oct 08, 2013 Entered By: BONNY LUSHER H Comment: Normal               colonoscopy 12-19-12-see gi consult   03/06/2023: 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, 2018 and 01/06/2023: Stroke (HCC)     Comment:  weakness right arm 03/06/2023: Tobacco use disorder 05/10/2024: Tobacco user 03/06/2023: 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  diabetic retinopathy without macular edema, unspecified eye (HCC) No date: Wears glasses  Reproductive/Obstetrics                              Anesthesia Physical Anesthesia Plan  ASA: 3  Anesthesia Plan: General   Post-op Pain Management: Minimal or no pain anticipated   Induction: Intravenous  PONV Risk Score and Plan: 2 and Ondansetron  and Dexamethasone   Airway Management Planned: Oral ETT  Additional Equipment: None  Intra-op Plan:   Post-operative Plan: Extubation in OR  Informed Consent: I have  reviewed the patients History and Physical, chart, labs and discussed the procedure including the risks, benefits and alternatives for the proposed anesthesia with the patient or authorized representative who has indicated his/her understanding and acceptance.     Dental advisory given  Plan Discussed with: CRNA and Surgeon  Anesthesia Plan Comments: (Discussed risks of anesthesia with patient, including PONV, sore throat, lip/dental/eye damage. Rare risks discussed as well, such as cardiorespiratory and neurological sequelae, and allergic reactions. Discussed the role of CRNA in patient's perioperative care. Patient understands.)        Anesthesia Quick Evaluation  "

## 2024-10-25 NOTE — Anesthesia Procedure Notes (Signed)
 Procedure Name: Intubation Date/Time: 10/25/2024 10:11 AM  Performed by: Vera Rochele PARAS, CRNAPre-anesthesia Checklist: Patient identified, Emergency Drugs available, Suction available and Patient being monitored Patient Re-evaluated:Patient Re-evaluated prior to induction Oxygen Delivery Method: Circle System Utilized Preoxygenation: Pre-oxygenation with 100% oxygen Induction Type: IV induction Ventilation: Mask ventilation without difficulty Laryngoscope Size: Miller and 3 Grade View: Grade I Tube type: Oral Tube size: 7.5 mm Number of attempts: 1 Airway Equipment and Method: Stylet and Oral airway Placement Confirmation: ETT inserted through vocal cords under direct vision, positive ETCO2 and breath sounds checked- equal and bilateral Secured at: 23 cm Tube secured with: Tape Dental Injury: Teeth and Oropharynx as per pre-operative assessment

## 2024-10-25 NOTE — Progress Notes (Signed)
 Up and walked and tolerated well; bilat groins stable, no bleeding or hematoma; voided without difficulty

## 2024-10-25 NOTE — Transfer of Care (Signed)
 Immediate Anesthesia Transfer of Care Note  Patient: James Paul  Procedure(s) Performed: ATRIAL FIBRILLATION ABLATION  Patient Location: PACU  Anesthesia Type:General  Level of Consciousness: awake, alert , and oriented  Airway & Oxygen Therapy: Patient Spontanous Breathing and Patient connected to nasal cannula oxygen  Post-op Assessment: Report given to RN and Post -op Vital signs reviewed and stable  Post vital signs: Reviewed and stable  Last Vitals:  Vitals Value Taken Time  BP 116/72 10/25/24 11:47  Temp    Pulse 91 10/25/24 11:48  Resp 15 10/25/24 11:48  SpO2 90 % 10/25/24 11:48  Vitals shown include unfiled device data.  Last Pain: There were no vitals filed for this visit.       Complications: There were no known notable events for this encounter.

## 2024-10-25 NOTE — H&P (Signed)
" °  Electrophysiology Office Note:   Date:  10/25/2024  ID:  James Paul, DOB 04-Sep-1946, MRN [id number]  Primary Cardiologist: Lamar Fitch, MD Primary Heart Failure: None Electrophysiologist: Vanshika Jastrzebski Gladis Norton, MD      History of Present Illness:   James Paul is a 79 y.o. male with h/o AVR/MVR 2019, CVA, atrial fibrillation, hypertension, hyperlipidemia, PAD, tobacco abuse seen today for  for Electrophysiology evaluation of atrial fibrillation at the request of Krasowski.    His atrial fibrillation makes him feel significantly fatigued, short of breath.  He had an attempted TEE approximately a year ago, but was found to have left atrial appendage thrombus.  Cardioversion was not performed.  He does not have much energy to exert himself.  He does not have chest tightness or chest pressure.  Today, denies symptoms of palpitations, chest pain, dyspnea, orthopnea, PND, lower extremity edema, claudication, dizziness, presyncope, syncope, bleeding, or neurologic sequela. The patient is tolerating medications without difficulties. Plan ablation today.   Review of systems complete and found to be negative unless listed in HPI.   EP Information / Studies Reviewed:    EKG is ordered today. Personal review as below.   Atrial fibrillation    Risk Assessment/Calculations:    CHA2DS2-VASc Score = 4   This indicates a 4.8% annual risk of stroke. The patient's score is based upon: CHF History: 0 HTN History: 1 Diabetes History: 0 Stroke History: 0 Vascular Disease History: 1 Age Score: 2 Gender Score: 0           Physical Exam:   VS:  There were no vitals taken for this visit.   Wt Readings from Last 3 Encounters:  07/01/24 101.7 kg  05/14/24 102.4 kg  12/13/23 98.6 kg    GEN: Well nourished, well developed in no acute distress NECK: No JVD; No carotid bruits CARDIAC: Regular rate and rhythm, no murmurs, rubs, gallops RESPIRATORY:  Clear to auscultation without  rales, wheezing or rhonchi  ABDOMEN: Soft, non-tender, non-distended EXTREMITIES:  No edema; No deformity    ASSESSMENT AND PLAN:    1.  Persistent atrial fibrillation: James Paul has presented today for surgery, with the diagnosis of AF.  The various methods of treatment have been discussed with the patient and family. After consideration of risks, benefits and other options for treatment, the patient has consented to  Procedure(s): Catheter ablation as a surgical intervention .  Risks include but not limited to complete heart block, stroke, esophageal damage, nerve damage, bleeding, vascular damage, tamponade, perforation, MI, and death. The patient's history has been reviewed, patient examined, no change in status, stable for surgery.  I have reviewed the patient's chart and labs.  Questions were answered to the patient's satisfaction.    Shakeisha Horine Norton, MD 10/25/2024 8:38 AM  "

## 2024-10-25 NOTE — Anesthesia Postprocedure Evaluation (Signed)
"   Anesthesia Post Note  Patient: James Paul  Procedure(s) Performed: ATRIAL FIBRILLATION ABLATION     Patient location during evaluation: PACU Anesthesia Type: General Level of consciousness: awake and alert Pain management: pain level controlled Vital Signs Assessment: post-procedure vital signs reviewed and stable Respiratory status: spontaneous breathing, nonlabored ventilation, respiratory function stable and patient connected to nasal cannula oxygen Cardiovascular status: blood pressure returned to baseline and stable Postop Assessment: no apparent nausea or vomiting Anesthetic complications: no   There were no known notable events for this encounter.  Last Vitals:  Vitals:   10/25/24 1210 10/25/24 1220  BP: 98/79 95/65  Pulse: 88 87  Resp: (!) 22 18  Temp:  36.6 C  SpO2: 95% 92%    Last Pain:  Vitals:   10/25/24 1220  TempSrc: Axillary  PainSc: 0-No pain                 [name]      "

## 2024-10-25 NOTE — Telephone Encounter (Signed)
 Warfarin 5mg  Dx-Atrial fibrillation  H/O mitral valve replacement  Last INR Check-10/22/24 Last OV- 07/01/24

## 2024-10-27 ENCOUNTER — Other Ambulatory Visit: Payer: Self-pay | Admitting: Cardiology

## 2024-11-12 ENCOUNTER — Ambulatory Visit

## 2024-11-21 ENCOUNTER — Ambulatory Visit (HOSPITAL_COMMUNITY): Admitting: Nurse Practitioner

## 2024-12-27 ENCOUNTER — Ambulatory Visit: Admitting: Cardiology

## 2025-01-22 ENCOUNTER — Ambulatory Visit (HOSPITAL_COMMUNITY): Admitting: Nurse Practitioner
